# Patient Record
Sex: Male | Born: 1985 | Race: White | Hispanic: No | Marital: Married | Smoking: Never smoker
Health system: Southern US, Community
[De-identification: ages and names within clinical notes are randomized; demographics above are authoritative.]

## PROBLEM LIST (undated history)

## (undated) DIAGNOSIS — T7840XA Allergy, unspecified, initial encounter: Secondary | ICD-10-CM

## (undated) DIAGNOSIS — J45909 Unspecified asthma, uncomplicated: Secondary | ICD-10-CM

## (undated) DIAGNOSIS — I10 Essential (primary) hypertension: Secondary | ICD-10-CM

## (undated) DIAGNOSIS — I82409 Acute embolism and thrombosis of unspecified deep veins of unspecified lower extremity: Secondary | ICD-10-CM

## (undated) HISTORY — DX: Acute embolism and thrombosis of unspecified deep veins of unspecified lower extremity: I82.409

## (undated) HISTORY — DX: Unspecified asthma, uncomplicated: J45.909

## (undated) HISTORY — DX: Allergy, unspecified, initial encounter: T78.40XA

## (undated) HISTORY — DX: Essential (primary) hypertension: I10

## (undated) HISTORY — PX: APPENDECTOMY: SHX54

---

## 2008-10-13 ENCOUNTER — Encounter (INDEPENDENT_AMBULATORY_CARE_PROVIDER_SITE_OTHER): Payer: Self-pay | Admitting: *Deleted

## 2008-10-14 ENCOUNTER — Inpatient Hospital Stay (HOSPITAL_COMMUNITY): Admission: EM | Admit: 2008-10-14 | Discharge: 2008-10-15 | Payer: Self-pay | Admitting: Emergency Medicine

## 2011-05-02 NOTE — Op Note (Signed)
NAMEMITCHEL, DELDUCA               ACCOUNT NO.:  [id number]   MEDICAL RECORD NO.:  [id number]          PATIENT TYPE:  OBV   LOCATION:  1610                         FACILITY:  [facility]   PHYSICIAN:  Alfonse Ras, MD   DATE OF BIRTH:  [birth year]   DATE OF PROCEDURE:  DATE OF DISCHARGE:                               OPERATIVE REPORT   PREOPERATIVE DIAGNOSIS:  Acute appendicitis.   POSTOPERATIVE DIAGNOSIS:  Gangrenous appendicitis.   PROCEDURE:  Laparoscopic appendectomy.   SURGEON:  Alfonse Ras, M.D.   ANESTHESIA:  General.   DESCRIPTION:  The patient was taken to the operating room and placed in  supine position.  After adequate general anesthesia was induced, the  Foley catheter was placed and the abdomen was prepped and draped in  normal sterile fashion.  After the appropriate time out with the OR  staff, using a left subcostal incision, I utilized a 12-mm OptiVu and  entered the peritoneum with direct visualization.  Additional 12-mm port  was placed in the left lower quadrant and 5-mm port was placed in the  left mid-abdomen, both visioned.  The appendix was mobilized and was  quite discolored.  The mesentery, however, was difficult.  Using a  harmonic scalpel, some fatty mesentery was taken down and the base of  the appendix was easily visualized and was transected using a white load  GIA 45-mm stapling device.  The staple line appeared intact.  The right  lower quadrant and right pericolic gutter was copiously irrigated with  saline.  The appendix which had been placed in EndoCatch bag was removed  from the left lower quadrant port.  Adequate hemostasis was ensured.  Pneumoperitoneum was released.  Trocars were removed and skin incisions  were October 27, 2009closed with subcuticular 4-0 Vicryl sutures.  Steri-  Strips and dressings were applied.  The patient tolerated the procedure  well and went to PACU in good condition.      Alfonse Ras, MD  Electronically Signed     KRE/MEDQ  D:  10/13/2008  T:  10/14/2008  Job:  [id number]

## 2011-05-02 NOTE — H&P (Signed)
NAME:  Jamie Newton, Jamie Newton NO.:  [id number]   MEDICAL RECORD NO.:  [id number]          PATIENT TYPE:  EMS   LOCATION:  ED                           FACILITY:  [facility]   PHYSICIAN:  Alfonse Ras, MD   DATE OF BIRTH:  [birth year]   DATE OF ADMISSION:  10/12/2008  DATE OF DISCHARGE:                              HISTORY & PHYSICAL   ADMISSION DIAGNOSIS:  Acute appendicitis.   HISTORY OF PRESENT ILLNESS:  The patient is a very pleasant 25 year old  white male with a chronic history over the last 10 months of  intermittent right-sided abdominal pain, and nausea.  The patient  presented to the emergency room with complaints of this again last  night.  CT scan of the abdomen showed inflammation near and around the  appendix with an enlarged appendix.  No evidence of free fluid or  perforation.  His white count was elevated 15.6000.   PAST MEDICAL HISTORY:  Significant for asthma.   MEDICATIONS:  Include albuterol only.   SOCIAL HISTORY:  Negative for tobacco use.  He works at a [employer].   PHYSICAL EXAMINATION:  GENERAL: On physical exam, he is an age-  appropriate white male in no distress.  VITAL SIGNS:  His temperature is 101.2, blood pressure was 147/108,  heart rate is 120, and respiratory rate is 16.  HEENT:  Exam is benign.  Normocephalic, atraumatic.  Pupils are equal,  round, and reactive.  LUNGS:  Clear to auscultation and percussion x2.  Heart:  Regular rhythm.  Mildly tachycardiac.  ABDOMEN:  Soft, minimally tender in the right lower quadrant even to  fairly deep palpation.  EXTREMITY:  Exam shows no clubbing, cyanosis or edema and significant  amount of tattooing.   IMPRESSION:  Acute appendicitis.   PLAN:  Admission, IV antibiotics and laparoscopic appendectomy.  I  discussed the risks, benefits, and options with the patient.  He  understands the risks of bleeding, infection, perforation, conversion to  open procedure and he wishes to  proceed.      Alfonse Ras, MD  Electronically Signed     KRE/MEDQ  D:  10/13/2008  T:  10/13/2008  Job:  [id number]

## 2011-05-02 NOTE — Discharge Summary (Signed)
NAMETADD, HOLTMEYER               ACCOUNT NO.:  [id number]   MEDICAL RECORD NO.:  [id number]          PATIENT TYPE:  INP   LOCATION:  1618                         FACILITY:  [facility]   PHYSICIAN:  Alfonse Ras, MD   DATE OF BIRTH:  1986/04/30   DATE OF ADMISSION:  10/12/2008  DATE OF DISCHARGE:  10/15/2008                               DISCHARGE SUMMARY   ADMISSION DIAGNOSIS:  Acute appendicitis.   DISCHARGE DIAGNOSIS:  Gangrenous appendicitis.   MEDICATIONS:  At discharge include ciprofloxacin 5 mg 1 orally, twice a  day., Vicodin p.r.n., and albuterol nebulizers p.r.n.   HOSPITAL COURSE:  The patient was admitted with acute appendicitis went  to the operating room, had a laparoscopic appendectomy for gangrenous  appendicitis.  He was maintained on IV antibiotics.  He spiked a  temperature to 103 at night of surgery.  However, over the next 24 hours  defervesced.  His white count also decreased to 12,000.  By  postoperative day #2, he was feeling well, taking p.o., and was asking  to go home.  He was discharged to home and will follow up with me in 3  weeks.      Alfonse Ras, MD  Electronically Signed     KRE/MEDQ  D:  10/15/2008  T:  10/15/2008  Job:  [id number]

## 2011-09-19 LAB — CBC
HCT: 39.5
HCT: 43
Hemoglobin: 16.8
MCHC: 35.4
MCV: 91
MCV: 91.5
Platelets: 149 — ABNORMAL LOW
RBC: 4.33
RBC: 4.7
RBC: 5.38
RDW: 12.8
RDW: 13
WBC: 12.5 — ABNORMAL HIGH
WBC: 13.1 — ABNORMAL HIGH

## 2011-09-19 LAB — HEMOGLOBIN AND HEMATOCRIT, BLOOD
HCT: 45
Hemoglobin: 15.5

## 2011-09-19 LAB — COMPREHENSIVE METABOLIC PANEL
AST: 28
Albumin: 4.2
Alkaline Phosphatase: 85
BUN: 9
Calcium: 9.4
Chloride: 100
Creatinine, Ser: 0.97
GFR calc Af Amer: >60
GFR calc non Af Amer: >60
Glucose, Bld: 92
Potassium: 3.2 — ABNORMAL LOW
Total Bilirubin: 1.4 — ABNORMAL HIGH
Total Protein: 7.8

## 2011-09-19 LAB — DIFFERENTIAL
Basophils Relative: 0
Eosinophils Absolute: 0
Lymphocytes Relative: 7 — ABNORMAL LOW
Monocytes Absolute: 0.7
Neutro Abs: 13.8 — ABNORMAL HIGH
Neutrophils Relative %: 89 — ABNORMAL HIGH

## 2011-09-19 LAB — URINALYSIS, ROUTINE W REFLEX MICROSCOPIC
Glucose, UA: NEGATIVE
Specific Gravity, Urine: 1.007
Urobilinogen, UA: 1

## 2011-09-19 LAB — URINE CULTURE
Colony Count: NO GROWTH
Culture: NO GROWTH

## 2011-09-19 LAB — URINE MICROSCOPIC-ADD ON

## 2012-07-24 ENCOUNTER — Ambulatory Visit: Payer: Self-pay | Admitting: Family Medicine

## 2012-07-24 VITALS — BP 143/98 | HR 106 | Temp 97.9°F | Resp 16 | Ht 73.0 in | Wt 342.0 lb

## 2012-07-24 DIAGNOSIS — B356 Tinea cruris: Secondary | ICD-10-CM

## 2012-07-24 MED ORDER — LOSARTAN POTASSIUM 100 MG PO TABS: 100.0000 mg | ORAL_TABLET | Freq: Every day | ORAL | Status: DC

## 2012-07-24 MED ORDER — KETOCONAZOLE 200 MG PO TABS: 200.0000 mg | ORAL_TABLET | Freq: Every day | ORAL | Status: AC

## 2012-07-24 NOTE — Progress Notes (Signed)
Three days of scrotal rash.  Unresponsive to lotrimin.  O:  Obvious scrotal erythema and swelling with scaling on shaft of pnis as well  1. Tinea cruris  ketoconazole (NIZORAL) 200 MG tablet

## 2014-03-19 ENCOUNTER — Other Ambulatory Visit: Payer: Self-pay | Admitting: Orthopaedic Surgery

## 2014-03-19 DIAGNOSIS — M25571 Pain in right ankle and joints of right foot: Secondary | ICD-10-CM

## 2014-05-14 ENCOUNTER — Telehealth: Payer: Self-pay

## 2014-05-14 DIAGNOSIS — K625 Hemorrhage of anus and rectum: Secondary | ICD-10-CM

## 2014-05-14 MED ORDER — MOVIPREP 100 G PO SOLR: 1.0000 | Freq: Once | ORAL | Status: DC

## 2014-05-14 NOTE — Telephone Encounter (Signed)
Left message on machine to call back  

## 2014-05-14 NOTE — Telephone Encounter (Signed)
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 He is 27, has BMI of 45, has intermittent rectal bleeding for about 3 years. I'd like to have him scheduled for colonoscopy at [facility]. No rush. His contact number is [phone]

## 2014-05-14 NOTE — Telephone Encounter (Signed)
Pt has been scheduled he will come by tomorrow morning around 8 to get instructions

## 2014-05-18 ENCOUNTER — Encounter: Payer: Self-pay | Admitting: Gastroenterology

## 2014-05-18 ENCOUNTER — Ambulatory Visit (AMBULATORY_SURGERY_CENTER): Payer: BC Managed Care – PPO | Admitting: Gastroenterology

## 2014-05-18 VITALS — BP 105/45 | HR 72 | Temp 96.8°F | Resp 20 | Ht 73.0 in | Wt 342.0 lb

## 2014-05-18 DIAGNOSIS — K625 Hemorrhage of anus and rectum: Secondary | ICD-10-CM

## 2014-05-18 MED ORDER — SODIUM CHLORIDE 0.9 % IV SOLN: 500.0000 mL | INTRAVENOUS | Status: DC

## 2014-05-18 NOTE — Progress Notes (Signed)
Tatoo's bilateral arms and hands, stated every things out when questioned about piecing.

## 2014-05-18 NOTE — Patient Instructions (Signed)
Normal colonoscopy today. Resume current medications. Call us with any questions or concerns. Thank you!!  YOU HAD AN ENDOSCOPIC PROCEDURE TODAY AT THE [facility]: Refer to the procedure report that was given to you for any specific questions about what was found during the examination.  If the procedure report does not answer your questions, please call your gastroenterologist to clarify.  If you requested that your care partner not be given the details of your procedure findings, then the procedure report has been included in a sealed envelope for you to review at your convenience later.  YOU SHOULD EXPECT: Some feelings of bloating in the abdomen. Passage of more gas than usual.  Walking can help get rid of the air that was put into your GI tract during the procedure and reduce the bloating. If you had a lower endoscopy (such as a colonoscopy or flexible sigmoidoscopy) you may notice spotting of blood in your stool or on the toilet paper. If you underwent a bowel prep for your procedure, then you may not have a normal bowel movement for a few days.  DIET: Your first meal following the procedure should be a light meal and then it is ok to progress to your normal diet.  A half-sandwich or bowl of soup is an example of a good first meal.  Heavy or fried foods are harder to digest and may make you feel nauseous or bloated.  Likewise meals heavy in dairy and vegetables can cause extra gas to form and this can also increase the bloating.  Drink plenty of fluids but you should avoid alcoholic beverages for 24 hours.  ACTIVITY: Your care partner should take you home directly after the procedure.  You should plan to take it easy, moving slowly for the rest of the day.  You can resume normal activity the day after the procedure however you should NOT DRIVE or use heavy machinery for 24 hours (because of the sedation medicines used during the test).    SYMPTOMS TO REPORT IMMEDIATELY: A  gastroenterologist can be reached at any hour.  During normal business hours, 8:30 AM to 5:00 PM Monday through Friday, call [phone].  After hours and on weekends, please call the GI answering service at [phone] who will take a message and have the physician on call contact you.   Following lower endoscopy (colonoscopy or flexible sigmoidoscopy):  Excessive amounts of blood in the stool  Significant tenderness or worsening of abdominal pains  Swelling of the abdomen that is new, acute  Fever of 100F or higher  Following upper endoscopy (EGD)  Vomiting of blood or coffee ground material  New chest pain or pain under the shoulder blades  Painful or persistently difficult swallowing  New shortness of breath  Fever of 100F or higher  Black, tarry-looking stools  FOLLOW UP: If any biopsies were taken you will be contacted by phone or by letter within the next 1-3 weeks.  Call your gastroenterologist if you have not heard about the biopsies in 3 weeks.  Our staff will call the home number listed on your records the next business day following your procedure to check on you and address any questions or concerns that you may have at that time regarding the information given to you following your procedure. This is a courtesy call and so if there is no answer at the home number and we have not heard from you through the emergency physician on call, we will assume that  you have returned to your regular daily activities without incident.  SIGNATURES/CONFIDENTIALITY: You and/or your care partner have signed paperwork which will be entered into your electronic medical record.  These signatures attest to the fact that that the information above on your After Visit Summary has been reviewed and is understood.  Full responsibility of the confidentiality of this discharge information lies with you and/or your care-partner.

## 2014-05-18 NOTE — Progress Notes (Signed)
Procedure ends, to recovery, report given and VSS. 

## 2014-05-18 NOTE — Op Note (Signed)
[facility] [address] [place], [address]   COLONOSCOPY PROCEDURE REPORT  PATIENT: Jamie Newton, Jamie Newton  MR#: [id number] BIRTHDATE: 10/14/1986 , 27  yrs. old GENDER: Male ENDOSCOPIST: Rachael Fee, MD PROCEDURE DATE:  05/18/2014 PROCEDURE:   Colonoscopy, diagnostic First Screening Colonoscopy - Avg.  risk and is 50 yrs.  old or older - No.  Prior Negative Screening - Now for repeat screening. N/A  History of Adenoma - Now for follow-up colonoscopy & has been > or = to 3 yrs.  N/A  Polyps Removed Today? No.  Recommend repeat exam, <10 yrs? No. ASA CLASS:   Class II INDICATIONS:Rectal Bleeding. MEDICATIONS: MAC sedation, administered by CRNA and propofol (Diprivan) 350mg  IV  DESCRIPTION OF PROCEDURE:   After the risks benefits and alternatives of the procedure were thoroughly explained, informed consent was obtained.  A digital rectal exam revealed no abnormalities of the rectum.   The LB [id number]  endoscope was introduced through the anus and advanced to the cecum, which was identified by both the appendix and ileocecal valve. No adverse events experienced.   The quality of the prep was good.  The instrument was then slowly withdrawn as the colon was fully examined.   COLON FINDINGS: A normal appearing cecum, ileocecal valve, and appendiceal orifice were identified.  The ascending, hepatic flexure, transverse, splenic flexure, descending, sigmoid colon and rectum appeared unremarkable.  No polyps or cancers were seen. Retroflexed views revealed no abnormalities. The time to cecum=3 minutes 47 seconds.  Withdrawal time=7 minutes 21 seconds.  The scope was withdrawn and the procedure completed. COMPLICATIONS: There were no complications.  ENDOSCOPIC IMPRESSION: Normal colon The mild intermittent rectal bleeding that you see if almost certainly from small hemorrhoids that can come and go.  RECOMMENDATIONS: Follow clinically.  You should start  colon cancer screening at age 32.   eSigned:  Rachael Fee, MD 05/18/2014 11:01 AM

## 2014-05-19 ENCOUNTER — Telehealth: Payer: Self-pay | Admitting: *Deleted

## 2014-05-19 NOTE — Telephone Encounter (Signed)
Message left

## 2016-02-03 ENCOUNTER — Other Ambulatory Visit: Payer: Self-pay | Admitting: Sports Medicine

## 2016-02-03 DIAGNOSIS — M25521 Pain in right elbow: Secondary | ICD-10-CM

## 2016-02-08 ENCOUNTER — Other Ambulatory Visit: Payer: Self-pay | Admitting: Sports Medicine

## 2016-02-08 ENCOUNTER — Ambulatory Visit
Admission: RE | Admit: 2016-02-08 | Discharge: 2016-02-08 | Disposition: A | Discharge status: Home / Self Care | Payer: BLUE CROSS/BLUE SHIELD | Source: Ambulatory Visit | Attending: Sports Medicine | Admitting: Sports Medicine

## 2016-02-08 DIAGNOSIS — Z77018 Contact with and (suspected) exposure to other hazardous metals: Secondary | ICD-10-CM

## 2016-02-08 DIAGNOSIS — M25521 Pain in right elbow: Secondary | ICD-10-CM

## 2016-10-03 DIAGNOSIS — I10 Essential (primary) hypertension: Secondary | ICD-10-CM | POA: Insufficient documentation

## 2016-10-04 ENCOUNTER — Encounter: Payer: Self-pay | Admitting: Family Medicine

## 2016-10-04 ENCOUNTER — Ambulatory Visit (INDEPENDENT_AMBULATORY_CARE_PROVIDER_SITE_OTHER): Payer: BLUE CROSS/BLUE SHIELD | Admitting: Family Medicine

## 2016-10-04 VITALS — BP 140/97 | HR 87 | Ht 73.25 in | Wt 359.5 lb

## 2016-10-04 DIAGNOSIS — I10 Essential (primary) hypertension: Secondary | ICD-10-CM | POA: Diagnosis not present

## 2016-10-04 DIAGNOSIS — R5382 Chronic fatigue, unspecified: Secondary | ICD-10-CM | POA: Diagnosis not present

## 2016-10-04 DIAGNOSIS — J3089 Other allergic rhinitis: Secondary | ICD-10-CM

## 2016-10-04 DIAGNOSIS — J4520 Mild intermittent asthma, uncomplicated: Secondary | ICD-10-CM

## 2016-10-04 NOTE — Patient Instructions (Addendum)
Over the next couple weeks until I see you in follow-up please check her blood pressure at home and write it down and keep a log and bring in next office visit  Also please download my fitness pallor or lose it app and please track all foods and drink that you put in your body until follow-up. This will give [place] a good idea of exactly what right and what's wrong with your diet.    Exercising to Lose Weight Exercising can help you to lose weight. In order to lose weight through exercise, you need to do vigorous-intensity exercise. You can tell that you are exercising with vigorous intensity if you are breathing very hard and fast and cannot hold a conversation while exercising. Moderate-intensity exercise helps to maintain your current weight. You can tell that you are exercising at a moderate level if you have a higher heart rate and faster breathing, but you are still able to hold a conversation. HOW OFTEN SHOULD I EXERCISE? Choose an activity that you enjoy and set realistic goals. Your health care provider can help you to make an activity plan that works for you. Exercise regularly as directed by your health care provider. This may include:  Doing resistance training twice each week, such as:  Push-ups.  Sit-ups.  Lifting weights.  Using resistance bands.  Doing a given intensity of exercise for a given amount of time. Choose from these options:  150 minutes of moderate-intensity exercise every week.  75 minutes of vigorous-intensity exercise every week.  A mix of moderate-intensity and vigorous-intensity exercise every week. Children, pregnant women, people who are out of shape, people who are overweight, and older adults may need to consult a health care provider for individual recommendations. If you have any sort of medical condition, be sure to consult your health care provider before starting a new exercise program. WHAT ARE SOME ACTIVITIES THAT CAN HELP ME TO LOSE WEIGHT?    Walking at a rate of at least 4.5 miles an hour.  Jogging or running at a rate of 5 miles per hour.  Biking at a rate of at least 10 miles per hour.  Lap swimming.  Roller-skating or in-line skating.  Cross-country skiing.  Vigorous competitive sports, such as football, basketball, and soccer.  Jumping rope.  Aerobic dancing. HOW CAN I BE MORE ACTIVE IN MY DAY-TO-DAY ACTIVITIES?  Use the stairs instead of the elevator.  Take a walk during your lunch break.  If you drive, park your car farther away from work or school.  If you take public transportation, get off one stop early and walk the rest of the way.  Make all of your phone calls while standing up and walking around.  Get up, stretch, and walk around every 30 minutes throughout the day. WHAT GUIDELINES SHOULD I FOLLOW WHILE EXERCISING?  Do not exercise so much that you hurt yourself, feel dizzy, or get very short of breath.  Consult your health care provider prior to starting a new exercise program.  Wear comfortable clothes and shoes with good support.  Drink plenty of water while you exercise to prevent dehydration or heat stroke. Body water is lost during exercise and must be replaced.  Work out until you breathe faster and your heart beats faster.   This information is not intended to replace advice given to you by your health care provider. Make sure you discuss any questions you have with your health care provider.   Document Released: 01/06/2011 Document Revised:  12/25/2014 Document Reviewed: 05/07/2014 Elsevier Interactive Patient Education 2016 [employer]    Calorie Counting for [employer] Loss Calories are energy you get from the things you eat and drink. Your body uses this energy to keep you going throughout the day. The number of calories you eat affects your weight. When you eat more calories than your body needs, your body stores the extra calories as fat. When you eat fewer calories than your  body needs, your body burns fat to get the energy it needs. Calorie counting means keeping track of how many calories you eat and drink each day. If you make sure to eat fewer calories than your body needs, you should lose weight. In order for calorie counting to work, you will need to eat the number of calories that are right for you in a day to lose a healthy amount of weight per week. A healthy amount of weight to lose per week is usually 1-2 lb (0.5-0.9 kg). A dietitian can determine how many calories you need in a day and give you suggestions on how to reach your calorie goal.  WHAT IS MY MY PLAN? My goal is to have __________ calories per day.  If I have this many calories per day, I should lose around __________ pounds per week. WHAT DO I NEED TO KNOW ABOUT CALORIE COUNTING? In order to meet your daily calorie goal, you will need to:  Find out how many calories are in each food you would like to eat. Try to do this before you eat.  Decide how much of the food you can eat.  Write down what you ate and how many calories it had. Doing this is called keeping a food log. WHERE DO I FIND CALORIE INFORMATION? The number of calories in a food can be found on a Nutrition Facts label. Note that all the information on a label is based on a specific serving of the food. If a food does not have a Nutrition Facts label, try to look up the calories online or ask your dietitian for help. HOW DO I DECIDE HOW MUCH TO EAT? To decide how much of the food you can eat, you will need to consider both the number of calories in one serving and the size of one serving. This information can be found on the Nutrition Facts label. If a food does not have a Nutrition Facts label, look up the information online or ask your dietitian for help. Remember that calories are listed per serving. If you choose to have more than one serving of a food, you will have to multiply the calories per serving by the amount of servings you  plan to eat. For example, the label on a package of bread might say that a serving size is 1 slice and that there are 90 calories in a serving. If you eat 1 slice, you will have eaten 90 calories. If you eat 2 slices, you will have eaten 180 calories. HOW DO I KEEP A FOOD LOG? After each meal, record the following information in your food log:  What you ate.  How much of it you ate.  How many calories it had.  Then, add up your calories. Keep your food log near you, such as in a small notebook in your pocket. Another option is to use a mobile app or website. Some programs will calculate calories for you and show you how many calories you have left each time you add an item  to the log. WHAT ARE SOME CALORIE COUNTING TIPS?  Use your calories on foods and drinks that will fill you up and not leave you hungry. Some examples of this include foods like nuts and nut butters, vegetables, lean proteins, and high-fiber foods (more than 5 g fiber per serving).  Eat nutritious foods and avoid empty calories. Empty calories are calories you get from foods or beverages that do not have many nutrients, such as candy and soda. It is better to have a nutritious high-calorie food (such as an avocado) than a food with few nutrients (such as a bag of chips).  Know how many calories are in the foods you eat most often. This way, you do not have to look up how many calories they have each time you eat them.  Look out for foods that may seem like low-calorie foods but are really high-calorie foods, such as baked goods, soda, and fat-free candy.  Pay attention to calories in drinks. Drinks such as sodas, specialty coffee drinks, alcohol, and juices have a lot of calories yet do not fill you up. Choose low-calorie drinks like water and diet drinks.  Focus your calorie counting efforts on higher calorie items. Logging the calories in a garden salad that contains only vegetables is less important than calculating the  calories in a milk shake.  Find a way of tracking calories that works for you. Get creative. Most people who are successful find ways to keep track of how much they eat in a day, even if they do not count every calorie. WHAT ARE SOME PORTION CONTROL TIPS?  Know how many calories are in a serving. This will help you know how many servings of a certain food you can have.  Use a measuring cup to measure serving sizes. This is helpful when you start out. With time, you will be able to estimate serving sizes for some foods.  Take some time to put servings of different foods on your favorite plates, bowls, and cups so you know what a serving looks like.  Try not to eat straight from a bag or box. Doing this can lead to overeating. Put the amount you would like to eat in a cup or on a plate to make sure you are eating the right portion.  Use smaller plates, glasses, and bowls to prevent overeating. This is a quick and easy way to practice portion control. If your plate is smaller, less food can fit on it.  Try not to multitask while eating, such as watching TV or using your computer. If it is time to eat, sit down at a table and enjoy your food. Doing this will help you to start recognizing when you are full. It will also make you more aware of what and how much you are eating. HOW CAN I CALORIE COUNT WHEN EATING OUT?  Ask for smaller portion sizes or child-sized portions.  Consider sharing an entree and sides instead of getting your own entree.  If you get your own entree, eat only half. Ask for a box at the beginning of your meal and put the rest of your entree in it so you are not tempted to eat it.  Look for the calories on the menu. If calories are listed, choose the lower calorie options.  Choose dishes that include vegetables, fruits, whole grains, low-fat dairy products, and lean protein. Focusing on smart food choices from each of the 5 food groups can help you stay on track  at  restaurants.  Choose items that are boiled, broiled, grilled, or steamed.  Choose water, milk, unsweetened iced tea, or other drinks without added sugars. If you want an alcoholic beverage, choose a lower calorie option. For example, a regular margarita can have up to 700 calories and a glass of wine has around 150.  Stay away from items that are buttered, battered, fried, or served with cream sauce. Items labeled "crispy" are usually fried, unless stated otherwise.  Ask for dressings, sauces, and syrups on the side. These are usually very high in calories, so do not eat much of them.  Watch out for salads. Many people think salads are a healthy option, but this is often not the case. Many salads come with bacon, fried chicken, lots of cheese, fried chips, and dressing. All of these items have a lot of calories. If you want a salad, choose a garden salad and ask for grilled meats or steak. Ask for the dressing on the side, or ask for olive oil and vinegar or lemon to use as dressing.  Estimate how many servings of a food you are given. For example, a serving of cooked rice is  cup or about the size of half a tennis ball or one cupcake wrapper. Knowing serving sizes will help you be aware of how much food you are eating at restaurants. The list below tells you how big or small some common portion sizes are based on everyday objects.  1 oz--4 stacked dice.  3 oz--1 deck of cards.  1 tsp--1 dice.  1 Tbsp-- a Ping-Pong ball.  2 Tbsp--1 Ping-Pong ball.   cup--1 tennis ball or 1 cupcake wrapper.  1 cup--1 baseball.   This information is not intended to replace advice given to you by your health care provider. Make sure you discuss any questions you have with your health care provider.   Document Released: 12/04/2005 Document Revised: 12/25/2014 Document Reviewed: 10/09/2013 Elsevier Interactive Patient Education [employer]

## 2016-10-04 NOTE — Progress Notes (Signed)
New patient office visit note:  Impression and Recommendations:    1. HTN (hypertension), benign   2. Severe obesity (BMI >= 40) (HCC)   3. Environmental and seasonal allergies   4. Chronic fatigue   5. Mild intermittent asthma, uncomplicated     Over the next couple weeks until I see you in follow-up please check Your blood pressure at home and write it down and keep a log and bring in next office visit  Also please download my fitness pal or lose it app and please track all foods and drink that you put in your body until follow-up. This will give [place] a good idea of exactly what is right and what's wrong with your diet.   HTN (hypertension), benign Above goal. Today's blood pressure 140/97. Counseling done. Dietary and lifestyle modifications discussed. Monitor at home and follow-up blood pressure and fasting labs near future.  Severe obesity (BMI >= 40) (HCC) Explained to patient what BMI refers to, and what it means medically.    Told patient to think about it as a "medical risk stratification measurement" and how increasing BMI is associated with increasing risk/ or worsening state of various diseases such as hypertension, hyperlipidemia, diabetes, premature OA, depression etc.  American Heart Association guidelines for healthy diet, basically Mediterranean diet, and exercise guidelines of 30 minutes 5 days per week or more discussed in detail.  Health counseling performed.  All questions answered.  Mild intermittent asthma, uncomplicated Stable, a symptomatic currently. Not using albuterol in many many months.  Environmental and seasonal allergies Asymptomatic currently. Discussed fall allergies the patient. Over the counter medications discussed.   Follow-up as needed when necessary  Chronic fatigue We'll obtain fasting labs in the near future including TSH, vitamin D, but and B12, A1c.   Did discuss physical deconditioning with patient which is likely cause of his  fatigue. Dietary and lifestyle modifications discussed.    Orders Placed This Encounter  Procedures  . CBC with Differential/Platelet  . Hemoglobin A1c  . Lipid Panel w/reflex Direct LDL  . T4, free  . TSH  . VITAMIN D 25 Hydroxy (Vit-D Deficiency, Fractures)  . Vitamin B12    Pt was in the office today for 40+ minutes, with over 50% time spent in face to face counseling of various medical concerns and in coordination of care New Prescriptions   No medications on file    Modified Medications   No medications on file    Discontinued Medications   ALBUTEROL (PROVENTIL HFA;VENTOLIN HFA) 108 (90 BASE) MCG/ACT INHALER    Inhale 2 puffs into the lungs every 6 (six) hours as needed.    Return for F BW near future and then follow-up with me 3 weeks later - review labs, BP , diett.  The patient was counseled, risk factors were discussed, anticipatory guidance given.  Gross side effects, risk and benefits, and alternatives of medications discussed with patient.  Patient is aware that all medications have potential side effects and we are unable to predict every side effect or drug-drug interaction that may occur.  Expresses verbal understanding and consents to current therapy plan and treatment regimen.  Please see AVS handed out to patient at the end of our visit for further patient instructions/ counseling done pertaining to today's office visit.    Note: This document was prepared using Dragon voice recognition software and may include unintentional dictation errors.  ----------------------------------------------------------------------------------------------------------------------    Subjective:    Chief  Complaint  Patient presents with  . Establish Care    HPI: Jamie Newton is a pleasant 30 y.o. male who presents to [facility] today to review their medical history with me and establish care.   I asked the patient to review their  chronic problem list with me to ensure everything was updated and accurate.     Patient is married  to [employer]OPE with 2 children at home 64-year-old and one-month-old.  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 Works 12-14hrs days.  Hasn't seen PCP doc in 10-15 yrs now.  [facility] [place] [facility] [place]. --> only been 3-5 times total to  See doc in that time   280-300 lbs-- is his usual wt.  Over past 2 yrs--> has had fatigue, laziness.   Walks a lot at work still, gained all the extra wt over 2 yrs, hasn't changed diet or anything.    Was put on BP meds about 5 yrs ago--> was on it months--> but said he didn't need it anymore.  2 mo ago--> went to [facility]--> Told BP was essentially N.     Patient Care Team    Relationship Specialty Notifications Start End  Thomasene Lot, DO Referring Physician Family Medicine  10/11/16      Wt Readings from Last 3 Encounters:  10/04/16 (!) 359 lb 8 oz (163.1 kg)  02/08/16 (!) 340 lb (154.2 kg)  05/18/14 (!) 342 lb (155.1 kg)   BP Readings from Last 3 Encounters:  10/04/16 (!) 140/97  05/18/14 (!) 105/45  07/24/12 (!) 143/98   Pulse Readings from Last 3 Encounters:  10/04/16 87  05/18/14 72  07/24/12 (!) 106   BMI Readings from Last 3 Encounters:  10/04/16 47.11 kg/m  02/08/16 44.86 kg/m  05/18/14 45.12 kg/m     Patient Active Problem List   Diagnosis Date Noted  . Severe obesity (BMI >= 40) (HCC) 10/15/2016    Priority: High  . HTN (hypertension), benign 10/03/2016    Priority: High  . Mild intermittent asthma, uncomplicated 10/15/2016    Priority: Medium  . Environmental and seasonal allergies 10/15/2016    Priority: Low  . Chronic fatigue 10/15/2016    Priority: Low     Past Medical History:  Diagnosis Date  . Allergy    SEASONAL  . Asthma   . Hypertension      Past Surgical History:  Procedure Laterality Date  . APPENDECTOMY       Family History  Problem Relation Age of Onset  .  Healthy Brother   . Asthma Daughter   . Arthritis Maternal Grandmother   . Healthy Maternal Grandfather   . Early death Paternal Grandfather     unknown  . Healthy Brother   . Healthy Daughter      History  Drug Use No    History  Alcohol Use  . 1.2 oz/week  . 2 Cans of beer per week    History  Smoking Status  . Never Smoker  Smokeless Tobacco  . Never Used    Patient's Medications  New Prescriptions   No medications on file  Previous Medications   LOSARTAN (COZAAR) 100 MG TABLET    Take 1 tablet (100 mg total) by mouth daily.  Modified Medications   No medications on file  Discontinued Medications   ALBUTEROL (PROVENTIL HFA;VENTOLIN HFA) 108 (90 BASE) MCG/ACT INHALER    Inhale 2 puffs into the lungs  every 6 (six) hours as needed.    Allergies: Amoxicillin and Penicillins  Review of Systems  Constitutional: Negative.  Negative for chills, diaphoresis, fever, malaise/fatigue and weight loss.  HENT: Negative.  Negative for congestion, sore throat and tinnitus.   Eyes: Negative.  Negative for blurred vision, double vision and photophobia.  Respiratory: Negative.  Negative for cough and wheezing.   Cardiovascular: Negative.  Negative for chest pain and palpitations.  Gastrointestinal: Negative.  Negative for blood in stool, diarrhea, nausea and vomiting.  Genitourinary: Negative.  Negative for dysuria, frequency and urgency.  Musculoskeletal: Negative.  Negative for joint pain and myalgias.  Skin: Negative.  Negative for itching and rash.  Neurological: Negative.  Negative for dizziness, focal weakness, weakness and headaches.  Endo/Heme/Allergies: Negative.  Negative for environmental allergies and polydipsia. Does not bruise/bleed easily.  Psychiatric/Behavioral: Negative.  Negative for depression and memory loss. The patient is not nervous/anxious and does not have insomnia.      Objective:   Blood pressure (!) 140/97, pulse 87, height 6' 1.25" (1.861 m),  weight (!) 359 lb 8 oz (163.1 kg). Body mass index is 47.11 kg/m. General: Well Developed, well nourished, and in no acute distress.  Neuro: Alert and oriented x3, extra-ocular muscles intact, sensation grossly intact.  HEENT: Normocephalic, atraumatic, pupils equal round reactive to light, neck supple Skin: no gross suspicious lesions or rashes  Cardiac: Regular rate and rhythm, no murmurs rubs or gallops.  Respiratory: Essentially clear to auscultation bilaterally. Not using accessory muscles, speaking in full sentences.  Abdominal: Soft, not grossly distended Musculoskeletal: Ambulates w/o diff, FROM * 4 ext.  Vasc: less 2 sec cap RF, warm and pink  Psych:  No HI/SI, judgement and insight good, Euthymic mood. Full Affect.

## 2016-10-11 ENCOUNTER — Other Ambulatory Visit: Payer: Self-pay

## 2016-10-11 ENCOUNTER — Other Ambulatory Visit (INDEPENDENT_AMBULATORY_CARE_PROVIDER_SITE_OTHER): Payer: BLUE CROSS/BLUE SHIELD

## 2016-10-11 DIAGNOSIS — R5383 Other fatigue: Secondary | ICD-10-CM

## 2016-10-11 DIAGNOSIS — Z13220 Encounter for screening for lipoid disorders: Secondary | ICD-10-CM

## 2016-10-11 DIAGNOSIS — Z130 Encounter for screening for diseases of the blood and blood-forming organs and certain disorders involving the immune mechanism: Secondary | ICD-10-CM

## 2016-10-11 DIAGNOSIS — Z1321 Encounter for screening for nutritional disorder: Secondary | ICD-10-CM

## 2016-10-11 DIAGNOSIS — Z131 Encounter for screening for diabetes mellitus: Secondary | ICD-10-CM

## 2016-10-11 DIAGNOSIS — I10 Essential (primary) hypertension: Secondary | ICD-10-CM

## 2016-10-11 DIAGNOSIS — Z1329 Encounter for screening for other suspected endocrine disorder: Secondary | ICD-10-CM

## 2016-10-12 LAB — CBC WITH DIFFERENTIAL/PLATELET
BASOS PCT: 0 %
Basophils Absolute: 0 cells/uL (ref 0–200)
EOS ABS: 92 {cells}/uL (ref 15–500)
Eosinophils Relative: 1 %
HEMATOCRIT: 42.3 % (ref 38.5–50.0)
HEMOGLOBIN: 14.5 g/dL (ref 13.2–17.1)
LYMPHS ABS: 2944 {cells}/uL (ref 850–3900)
LYMPHS PCT: 32 %
MCH: 29.6 pg (ref 27.0–33.0)
MCHC: 34.3 g/dL (ref 32.0–36.0)
MCV: 86.3 fL (ref 80.0–100.0)
MONO ABS: 920 {cells}/uL (ref 200–950)
MPV: 10.1 fL (ref 7.5–12.5)
Monocytes Relative: 10 %
NEUTROS PCT: 57 %
Neutro Abs: 5244 cells/uL (ref 1500–7800)
Platelets: 233 10*3/uL (ref 140–400)
RBC: 4.9 MIL/uL (ref 4.20–5.80)
RDW: 13.5 % (ref 11.0–15.0)
WBC: 9.2 10*3/uL (ref 3.8–10.8)

## 2016-10-12 LAB — LIPID PANEL
CHOL/HDL RATIO: 3.9 ratio (ref <=5.0)
Cholesterol: 157 mg/dL (ref 125–200)
HDL: 40 mg/dL (ref >=40)
LDL Cholesterol: 94 mg/dL (ref <130)
Triglycerides: 116 mg/dL (ref <150)
VLDL: 23 mg/dL (ref <30)

## 2016-10-12 LAB — COMPLETE METABOLIC PANEL WITH GFR
ALT: 33 U/L (ref 9–46)
AST: 24 U/L (ref 10–40)
Albumin: 4 g/dL (ref 3.6–5.1)
Alkaline Phosphatase: 62 U/L (ref 40–115)
BUN: 15 mg/dL (ref 7–25)
CHLORIDE: 98 mmol/L (ref 98–110)
CO2: 25 mmol/L (ref 20–31)
Calcium: 9.2 mg/dL (ref 8.6–10.3)
Creat: 0.93 mg/dL (ref 0.60–1.35)
GFR, Est African American: >89 mL/min (ref >=60)
GLUCOSE: 79 mg/dL (ref 65–99)
POTASSIUM: 4.1 mmol/L (ref 3.5–5.3)
SODIUM: 135 mmol/L (ref 135–146)
Total Bilirubin: 0.6 mg/dL (ref 0.2–1.2)
Total Protein: 7.2 g/dL (ref 6.1–8.1)

## 2016-10-12 LAB — HEMOGLOBIN A1C
HEMOGLOBIN A1C: 4.6 % (ref <5.7)
MEAN PLASMA GLUCOSE: 85 mg/dL

## 2016-10-12 LAB — VITAMIN D 25 HYDROXY (VIT D DEFICIENCY, FRACTURES): Vit D, 25-Hydroxy: 35 ng/mL (ref 30–100)

## 2016-10-12 LAB — TSH: TSH: 2.59 mIU/L (ref 0.40–4.50)

## 2016-10-15 DIAGNOSIS — J4520 Mild intermittent asthma, uncomplicated: Secondary | ICD-10-CM | POA: Insufficient documentation

## 2016-10-15 DIAGNOSIS — R5382 Chronic fatigue, unspecified: Secondary | ICD-10-CM | POA: Insufficient documentation

## 2016-10-15 DIAGNOSIS — J3089 Other allergic rhinitis: Secondary | ICD-10-CM | POA: Insufficient documentation

## 2016-10-15 NOTE — Assessment & Plan Note (Signed)
Above goal. Today's blood pressure 140/97. Counseling done. Dietary and lifestyle modifications discussed. Monitor at home and follow-up blood pressure and fasting labs near future.

## 2016-10-15 NOTE — Assessment & Plan Note (Signed)
Asymptomatic currently. Discussed fall allergies the patient. Over the counter medications discussed.   Follow-up as needed when necessary

## 2016-10-15 NOTE — Assessment & Plan Note (Signed)
Stable, a symptomatic currently. Not using albuterol in many many months.

## 2016-10-15 NOTE — Assessment & Plan Note (Signed)
We'll obtain fasting labs in the near future including TSH, vitamin D, but and B12, A1c.   Did discuss physical deconditioning with patient which is likely cause of his fatigue. Dietary and lifestyle modifications discussed.

## 2016-10-15 NOTE — Assessment & Plan Note (Signed)

## 2016-11-02 ENCOUNTER — Ambulatory Visit (INDEPENDENT_AMBULATORY_CARE_PROVIDER_SITE_OTHER): Payer: BLUE CROSS/BLUE SHIELD | Admitting: Family Medicine

## 2016-11-02 ENCOUNTER — Encounter: Payer: Self-pay | Admitting: Family Medicine

## 2016-11-02 VITALS — BP 137/91 | HR 88 | Ht 73.25 in | Wt 351.2 lb

## 2016-11-02 DIAGNOSIS — R5382 Chronic fatigue, unspecified: Secondary | ICD-10-CM | POA: Diagnosis not present

## 2016-11-02 DIAGNOSIS — E559 Vitamin D deficiency, unspecified: Secondary | ICD-10-CM

## 2016-11-02 DIAGNOSIS — I10 Essential (primary) hypertension: Secondary | ICD-10-CM | POA: Diagnosis not present

## 2016-11-02 DIAGNOSIS — J3089 Other allergic rhinitis: Secondary | ICD-10-CM

## 2016-11-02 DIAGNOSIS — Z7189 Other specified counseling: Secondary | ICD-10-CM

## 2016-11-02 MED ORDER — VITAMIN D3 125 MCG (5000 UT) PO TABS: ORAL_TABLET | ORAL | 12 refills | Status: AC

## 2016-11-02 MED ORDER — LOSARTAN POTASSIUM-HCTZ 100-12.5 MG PO TABS: 1.0000 | ORAL_TABLET | Freq: Every day | ORAL | 1 refills | Status: DC

## 2016-11-02 NOTE — Assessment & Plan Note (Addendum)
-   bmi >er 45  - Patient concerned that his fatigue is related to low T not fact he has severe morbid obesity and is inactive.   Will obtain testosterone level today.

## 2016-11-02 NOTE — Patient Instructions (Addendum)
Patient will come in in near future for first thing a.m. testosterone level.  Bring in log of Bp's - check random times and after sitting 15-2720min- write it down  Try to eat #1 nutrients should be protein- the largest part of your pie graph, then carbohydrates, then fats. Try to eat closer to the amount of calories you're supposed eat within at least a couple 100 calories.   More water- 1/2 wt in oz/water per day  Please realize, EXERCISE IS MEDICINE!  -  American Heart Association [facility]( AHA) guidelines for exercise : If you are in good health, without any medical conditions, you should engage in 150 minutes of moderate intensity aerobic activity per week.  This means you should be huffing and puffing throughout your workout.   Engaging in regular exercise will improve brain function and memory, as well as improve mood, boost immune system and help with weight management.  As well as the other, more well-known effects of exercise such as decreasing blood sugar levels, decreasing blood pressure,  and decreasing bad cholesterol levels/ increasing good cholesterol levels.     -  The AHA strongly endorses consumption of a diet that contains a variety of foods from all the food categories with an emphasis on fruits and vegetables; fat-free and low-fat dairy products; cereal and grain products; legumes and nuts; and fish, poultry, and/or extra lean meats.    Excessive food intake, especially of foods high in saturated and trans fats, sugar, and salt, should be avoided.    Adequate water intake of roughly 1/2 of your weight in pounds, should equal the ounces of water per day you should drink.  So for instance, if you're 200 pounds, that would be 100 ounces of water per day.         Mediterranean Diet  Why follow it? Research shows. . Those who follow the Mediterranean diet have a reduced risk of heart disease  . The diet is associated with a reduced incidence of Parkinson's and Alzheimer's  diseases . People following the diet may have longer life expectancies and lower rates of chronic diseases  . The Dietary Guidelines for Americans recommends the Mediterranean diet as an eating plan to promote health and prevent disease  What Is the Mediterranean Diet?  . Healthy eating plan based on typical foods and recipes of Mediterranean-style cooking . The diet is primarily a plant based diet; these foods should make up a majority of meals   Starches - Plant based foods should make up a majority of meals - They are an important sources of vitamins, minerals, energy, antioxidants, and fiber - Choose whole grains, foods high in fiber and minimally processed items  - Typical grain sources include wheat, oats, barley, corn, brown rice, bulgar, farro, millet, polenta, couscous  - Various types of beans include chickpeas, lentils, fava beans, black beans, white beans   Fruits  Veggies - Large quantities of antioxidant rich fruits & veggies; 6 or more servings  - Vegetables can be eaten raw or lightly drizzled with oil and cooked  - Vegetables common to the traditional Mediterranean Diet include: artichokes, arugula, beets, broccoli, brussel sprouts, cabbage, carrots, celery, collard greens, cucumbers, eggplant, kale, leeks, lemons, lettuce, mushrooms, okra, onions, peas, peppers, potatoes, pumpkin, radishes, rutabaga, shallots, spinach, sweet potatoes, turnips, zucchini - Fruits common to the Mediterranean Diet include: apples, apricots, avocados, cherries, clementines, dates, figs, grapefruits, grapes, melons, nectarines, oranges, peaches, pears, pomegranates, strawberries, tangerines  Fats - Replace butter and margarine with  healthy oils, such as olive oil, canola oil, and tahini  - Limit nuts to no more than a handful a day  - Nuts include walnuts, almonds, pecans, pistachios, pine nuts  - Limit or avoid candied, honey roasted or heavily salted nuts - Olives are central to the Mediterranean  diet - can be eaten whole or used in a variety of dishes   Meats Protein - Limiting red meat: no more than a few times a month - When eating red meat: choose lean cuts and keep the portion to the size of deck of cards - Eggs: approx. 0 to 4 times a week  - Fish and lean poultry: at least 2 a week  - Healthy protein sources include, chicken, [place], lean beef, lamb - Increase intake of seafood such as tuna, salmon, trout, mackerel, shrimp, scallops - Avoid or limit high fat processed meats such as sausage and bacon  Dairy - Include moderate amounts of low fat dairy products  - Focus on healthy dairy such as fat free yogurt, skim milk, low or reduced fat cheese - Limit dairy products higher in fat such as whole or 2% milk, cheese, ice cream  Alcohol - Moderate amounts of red wine is ok  - No more than 5 oz daily for women (all ages) and men older than age 32  - No more than 10 oz of wine daily for men younger than 5965  Other - Limit sweets and other desserts  - Use herbs and spices instead of salt to flavor foods  - Herbs and spices common to the traditional Mediterranean Diet include: basil, bay leaves, chives, cloves, cumin, fennel, garlic, lavender, marjoram, mint, oregano, parsley, pepper, rosemary, sage, savory, sumac, tarragon, thyme   It's not just a diet, it's a lifestyle:  . The Mediterranean diet includes lifestyle factors typical of those in the region  . Foods, drinks and meals are best eaten with others and savored . Daily physical activity is important for overall good health . This could be strenuous exercise like running and aerobics . This could also be more leisurely activities such as walking, housework, yard-work, or taking the stairs . Moderation is the key; a balanced and healthy diet accommodates most foods and drinks . Consider portion sizes and frequency of consumption of certain foods   Meal Ideas & Options:  . Breakfast:  o Whole wheat toast or whole wheat English  muffins with peanut butter & hard boiled egg o Steel cut oats topped with apples & cinnamon and skim milk  o Fresh fruit: banana, strawberries, melon, berries, peaches  o Smoothies: strawberries, bananas, greek yogurt, peanut butter o Low fat greek yogurt with blueberries and granola  o Egg white omelet with spinach and mushrooms o Breakfast couscous: whole wheat couscous, apricots, skim milk, cranberries  . Sandwiches:  o Hummus and grilled vegetables (peppers, zucchini, squash) on whole wheat bread   o Grilled chicken on whole wheat pita with lettuce, tomatoes, cucumbers or tzatziki  o Tuna salad on whole wheat bread: tuna salad made with greek yogurt, olives, red peppers, capers, green onions o Garlic rosemary lamb pita: lamb sauted with garlic, rosemary, salt & pepper; add lettuce, cucumber, greek yogurt to pita - flavor with lemon juice and black pepper  . Seafood:  o Mediterranean grilled salmon, seasoned with garlic, basil, parsley, lemon juice and black pepper o Shrimp, lemon, and spinach whole-grain pasta salad made with low fat greek yogurt  o Seared scallops with lemon orzo  o Seared tuna steaks seasoned salt, pepper, coriander topped with tomato mixture of olives, tomatoes, olive oil, minced garlic, parsley, green onions and cappers  . Meats:  o Herbed greek chicken salad with kalamata olives, cucumber, feta  o Red bell peppers stuffed with spinach, bulgur, lean ground beef (or lentils) & topped with feta   o Kebabs: skewers of chicken, tomatoes, onions, zucchini, squash  o [place] burgers: made with red onions, mint, dill, lemon juice, feta cheese topped with roasted red peppers . Vegetarian o Cucumber salad: cucumbers, artichoke hearts, celery, red onion, feta cheese, tossed in olive oil & lemon juice  o Hummus and whole grain pita points with a greek salad (lettuce, tomato, feta, olives, cucumbers, red onion) o Lentil soup with celery, carrots made with vegetable broth,  garlic, salt and pepper  o Tabouli salad: parsley, bulgur, mint, scallions, cucumbers, tomato, radishes, lemon juice, olive oil, salt and pepper.

## 2016-11-02 NOTE — Progress Notes (Signed)
Assessment and plan:  1. HTN (hypertension), benign   2. Severe obesity (BMI >= 40) (HCC)   3. Chronic fatigue   4. Environmental and seasonal allergies   5. Vitamin D insufficiency   6. Counseling on health promotion and disease prevention     Chronic fatigue - bmi >er 45  - Patient concerned that his fatigue is related to low T not fact he has severe morbid obesity and is inactive.   Will obtain testosterone level today.  Severe obesity (BMI >= 40) (HCC) - Declines referral to bariatrics or nutritional counseling  - Counseling done- advised wt loss.  Use lose it or my fitness pal---> track all food and drinks.   Discussed with patient importance of weight loss to help achieve health goals and how increasing weight, correlates to increasing risk of disease. (or increasing risk of not controlling existing diseases.)  - Weight Watchers and regular weekly meetings highly rec  - Dietary modifications and exercise counseling done.    - Many patient's concerns and questions today regarding diet, healthier lifestyle habits etc. were addressed.  - Pt was in the office today for 40+ minutes, with over 50% time spent in face to face counseling of various medical concerns and in coordination of care  HTN (hypertension), benign BP still above bove goal 130/80 or less.   D/c losartan-->  Hyzaar 100-12.5 started today.  Counseling done.   Dietary and lifestyle modifications discussed.   Monitor at home and follow-up blood pressure and repeat BMP labs near future.  Vitamin D insufficiency - Counseling done regarding medication management and lifestyle modifications.  Counseling on health promotion and disease prevention - All recent labs were reviewed with patient.  All questions were answered.  Additional instructions given to pt on handout:  - Patient will come in in near future for first thing a.m. testosterone  level.  Bring in log of Bp's - check random times and after sitting 15-72mn- write it down  Try to eat #1 nutrients should be protein- the largest part of your pie graph, then carbohydrates, then fats. Try to eat closer to the amount of calories you're supposed eat within at least a couple 100 calories.   More water- 1/2 wt in oz/water per day    New Prescriptions   CHOLECALCIFEROL (VITAMIN D3) 5000 UNITS TABS    5,000 IU OTC vitamin D3 daily.   LOSARTAN-HYDROCHLOROTHIAZIDE (HYZAAR) 100-12.5 MG TABLET    Take 1 tablet by mouth daily.    Modified Medications   No medications on file    Discontinued Medications   LOSARTAN (COZAAR) 100 MG TABLET    Take 1 tablet (100 mg total) by mouth daily.    Return for 4-6 wks- BP f/up new med (bring in log) & Lose It app-Diet/ high protein etc.  Anticipatory guidance and routine counseling done re: condition, txmnt options and need for follow up. All questions of patient's were answered.   Gross side effects, risk and benefits, and alternatives of medications discussed with patient.  Patient is aware that all medications have potential side effects and we are unable to predict every sideeffect or drug-drug interaction that may occur.  Expresses verbal understanding and consents to current therapy plan and treatment regiment.  Please see AVS handed out to patient at the end of our visit for additional patient instructions/ counseling done pertaining to today's office visit.  Note: This document was prepared using [employer] analystand may  include unintentional dictation errors.   ----------------------------------------------------------------------------------------------------------------------  Subjective:   CC:   Jamie Newton is a 30 y.o. male who presents to [place] [facility] today for review and discussion of recent bloodwork that was done 10\25\17. Patient is appointed testosterone level was not  obtained.    1. All recent blood work that we ordered was reviewed with patient today.  Patient was counseled on all abnormalities and we discussed dietary and lifestyle changes that could help those values (also medications when appropriate).  Extensive health counseling performed and all patient's concerns/ questions were addressed.  2.  Bp at home 143/ 93- and right around there. Patient asymptomatic. Trying to watch salt diet admittedly is poor. No exercise.    Wt Readings from Last 3 Encounters:  11/02/16 (!) 351 lb 3.2 oz (159.3 kg)  10/04/16 (!) 359 lb 8 oz (163.1 kg)  02/08/16 (!) 340 lb (154.2 kg)   BP Readings from Last 3 Encounters:  11/02/16 (!) 137/91  10/04/16 (!) 140/97  05/18/14 (!) 105/45   Pulse Readings from Last 3 Encounters:  11/02/16 88  10/04/16 87  05/18/14 72   BMI Readings from Last 3 Encounters:  11/02/16 46.02 kg/m  10/04/16 47.11 kg/m  02/08/16 44.86 kg/m     Patient Care Team    Relationship Specialty Notifications Start End  Mellody Dance, DO PCP - General Family Medicine  11/02/16     Full medical history updated and reviewed in the office today  Patient Active Problem List   Diagnosis Date Noted  . Severe obesity (BMI >= 40) (HCC) 10/15/2016    Priority: High  . HTN (hypertension), benign 10/03/2016    Priority: High  . Mild intermittent asthma, uncomplicated 71/69/6789    Priority: Medium  . Environmental and seasonal allergies 10/15/2016    Priority: Low  . Chronic fatigue 10/15/2016    Priority: Low  . Vitamin D insufficiency 11/19/2016  . Counseling on health promotion and disease prevention 11/19/2016    Past Medical History:  Diagnosis Date  . Allergy    SEASONAL  . Asthma   . Hypertension     Past Surgical History:  Procedure Laterality Date  . APPENDECTOMY      Social History  Substance Use Topics  . Smoking status: Never Smoker  . Smokeless tobacco: Never Used  . Alcohol use 1.2 oz/week    2 Cans of  beer per week    Family Hx: Family History  Problem Relation Age of Onset  . Healthy Brother   . Asthma Daughter   . Arthritis Maternal Grandmother   . Healthy Maternal Grandfather   . Early death Paternal Grandfather     unknown  . Healthy Brother   . Healthy Daughter      Medications: Current Outpatient Prescriptions  Medication Sig Dispense Refill  . acetaminophen (TYLENOL) 650 MG CR tablet Take 650 mg by mouth every 8 (eight) hours as needed for pain.    . Misc Natural Products (OSTEO BI-FLEX JOINT SHIELD) TABS Take 1 tablet by mouth daily.    . Cholecalciferol (VITAMIN D3) 5000 units TABS 5,000 IU OTC vitamin D3 daily. 90 tablet 12  . losartan-hydrochlorothiazide (HYZAAR) 100-12.5 MG tablet Take 1 tablet by mouth daily. 90 tablet 1   No current facility-administered medications for this visit.     Allergies:  Allergies  Allergen Reactions  . Amoxicillin Rash  . Penicillins Rash     ROS: Review of Systems  Constitutional:  Negative.  Negative for chills, diaphoresis, fever, malaise/fatigue and weight loss.  HENT: Negative.  Negative for congestion, sore throat and tinnitus.   Eyes: Negative.  Negative for blurred vision, double vision and photophobia.  Respiratory: Negative.  Negative for cough and wheezing.   Cardiovascular: Negative.  Negative for chest pain and palpitations.  Gastrointestinal: Negative.  Negative for blood in stool, diarrhea, nausea and vomiting.  Genitourinary: Negative.  Negative for dysuria, frequency and urgency.  Musculoskeletal: Positive for joint pain. Negative for myalgias.  Skin: Negative.  Negative for itching and rash.  Neurological: Negative.  Negative for dizziness, focal weakness, weakness and headaches.  Endo/Heme/Allergies: Negative.  Negative for environmental allergies and polydipsia. Does not bruise/bleed easily.  Psychiatric/Behavioral: Negative.  Negative for depression and memory loss. The patient is not nervous/anxious and  does not have insomnia.     Objective:  Blood pressure (!) 137/91, pulse 88, height 6' 1.25" (1.861 m), weight (!) 351 lb 3.2 oz (159.3 kg). Body mass index is 46.02 kg/m. Gen:   Well NAD, A and O *3 HEENT:    [place]/AT, EOMI,  MMM, OP- clr Lungs:   Normal work of breathing. Distant but ECTA B/L, no Wh, rhonchi apprec Heart:   Distant due to habitus, RRR, S1, S2 WNL's, no MRG Abd:   No gross distention Exts:    warm, pink,  Brisk capillary refill, warm and well perfused.  Psych:    No HI/SI, judgement and insight good, Euthymic mood. Full Affect.   Recent Results (from the past 2160 hour(s))  COMPLETE METABOLIC PANEL WITH GFR     Status: None   Collection Time: 10/11/16  8:24 AM  Result Value Ref Range   Sodium 135 135 - 146 mmol/L   Potassium 4.1 3.5 - 5.3 mmol/L   Chloride 98 98 - 110 mmol/L   CO2 25 20 - 31 mmol/L   Glucose, Bld 79 65 - 99 mg/dL   BUN 15 7 - 25 mg/dL   Creat 0.93 0.60 - 1.35 mg/dL   Total Bilirubin 0.6 0.2 - 1.2 mg/dL   Alkaline Phosphatase 62 40 - 115 U/L   AST 24 10 - 40 U/L   ALT 33 9 - 46 U/L   Total Protein 7.2 6.1 - 8.1 g/dL   Albumin 4.0 3.6 - 5.1 g/dL   Calcium 9.2 8.6 - 10.3 mg/dL   GFR, Est African American >89 >=60 mL/min   GFR, Est Non African American >89 >=60 mL/min  Lipid Profile     Status: None   Collection Time: 10/11/16  8:24 AM  Result Value Ref Range   Cholesterol 157 125 - 200 mg/dL   Triglycerides 116 <150 mg/dL   HDL 40 >=40 mg/dL   Total CHOL/HDL Ratio 3.9 <=5.0 Ratio   VLDL 23 <30 mg/dL   LDL Cholesterol 94 <130 mg/dL    Comment:   Total Cholesterol/HDL Ratio:CHD Risk                        Coronary Heart Disease Risk Table                                        Men       Women          1/2 Average Risk              3.4  3.3              Average Risk              5.0        4.4           2X Average Risk              9.6        7.1           3X Average Risk             23.4       11.0 Use the calculated Patient Ratio  above and the CHD Risk table  to determine the patient's CHD Risk.   HgB A1c     Status: None   Collection Time: 10/11/16  8:24 AM  Result Value Ref Range   Hgb A1c MFr Bld 4.6 <5.7 %    Comment:   For the purpose of screening for the presence of diabetes:   <5.7%       Consistent with the absence of diabetes 5.7-6.4 %   Consistent with increased risk for diabetes (prediabetes) >=6.5 %     Consistent with diabetes   This assay result is consistent with a decreased risk of diabetes.   Currently, no consensus exists regarding use of hemoglobin A1c for diagnosis of diabetes in children.   According to American Diabetes Association (ADA) guidelines, hemoglobin A1c <7.0% represents optimal control in non-pregnant diabetic patients. Different metrics may apply to specific patient populations. Standards of Medical Care in Diabetes (ADA).      Mean Plasma Glucose 85 mg/dL  TSH     Status: None   Collection Time: 10/11/16  8:24 AM  Result Value Ref Range   TSH 2.59 0.40 - 4.50 mIU/L  Vitamin D (25 hydroxy)     Status: None   Collection Time: 10/11/16  8:24 AM  Result Value Ref Range   Vit D, 25-Hydroxy 35 30 - 100 ng/mL    Comment: Vitamin D Status           25-OH Vitamin D        Deficiency                <20 ng/mL        Insufficiency         20 - 29 ng/mL        Optimal             > or = 30 ng/mL   For 25-OH Vitamin D testing on patients on D2-supplementation and patients for whom quantitation of D2 and D3 fractions is required, the QuestAssureD 25-OH VIT D, (D2,D3), LC/MS/MS is recommended: order code [id number] (patients > 2 yrs).   CBC w/Diff     Status: None   Collection Time: 10/11/16  8:24 AM  Result Value Ref Range   WBC 9.2 3.8 - 10.8 K/uL   RBC 4.90 4.20 - 5.80 MIL/uL   Hemoglobin 14.5 13.2 - 17.1 g/dL   HCT 42.3 38.5 - 50.0 %   MCV 86.3 80.0 - 100.0 fL   MCH 29.6 27.0 - 33.0 pg   MCHC 34.3 32.0 - 36.0 g/dL   RDW 13.5 11.0 - 15.0 %   Platelets 233 140 - 400 K/uL     MPV 10.1 7.5 - 12.5 fL   Neutro Abs 5,244 1,500 - 7,800 cells/uL   Lymphs Abs 2,944 850 - 3,900 cells/uL   Monocytes  Absolute 920 200 - 950 cells/uL   Eosinophils Absolute 92 15 - 500 cells/uL   Basophils Absolute 0 0 - 200 cells/uL   Neutrophils Relative % 57 %   Lymphocytes Relative 32 %   Monocytes Relative 10 %   Eosinophils Relative 1 %   Basophils Relative 0 %   Smear Review Criteria for review not met   Testosterone Total,Free,Bio, Males     Status: None   Collection Time: 11/02/16  3:21 PM  Result Value Ref Range   Testosterone 450 250 - 827 ng/dL   Albumin 4.5 3.6 - 5.1 g/dL   Sex Hormone Binding 32 10 - 50 nmol/L   Testosterone, Free 61.4 46.0 - 224.0 pg/mL   Testosterone, Bioavailable 126.2 110.0 - 575.0 ng/dL

## 2016-11-03 LAB — TESTOSTERONE TOTAL,FREE,BIO, MALES
ALBUMIN: 4.5 g/dL (ref 3.6–5.1)
SEX HORMONE BINDING: 32 nmol/L (ref 10–50)
TESTOSTERONE BIOAVAILABLE: 126.2 ng/dL (ref 110.0–575.0)
TESTOSTERONE FREE: 61.4 pg/mL (ref 46.0–224.0)
TESTOSTERONE: 450 ng/dL (ref 250–827)

## 2016-11-19 DIAGNOSIS — Z7189 Other specified counseling: Secondary | ICD-10-CM | POA: Insufficient documentation

## 2016-11-19 DIAGNOSIS — E559 Vitamin D deficiency, unspecified: Secondary | ICD-10-CM | POA: Insufficient documentation

## 2016-11-19 NOTE — Assessment & Plan Note (Addendum)
-   Declines referral to bariatrics or nutritional counseling  - Counseling done- advised wt loss.  Use lose it or my fitness pal---> track all food and drinks.   Discussed with patient importance of weight loss to help achieve health goals and how increasing weight, correlates to increasing risk of disease. (or increasing risk of not controlling existing diseases.)  - Weight Watchers and regular weekly meetings highly rec  - Dietary modifications and exercise counseling done.    - Many patient's concerns and questions today regarding diet, healthier lifestyle habits etc. were addressed.  - Pt was in the office today for 40+ minutes, with over 50% time spent in face to face counseling of various medical concerns and in coordination of care

## 2016-11-19 NOTE — Assessment & Plan Note (Signed)
-   Counseling done regarding medication management and lifestyle modifications.

## 2016-11-19 NOTE — Assessment & Plan Note (Addendum)
BP still above bove goal 130/80 or less.   D/c losartan-->  Hyzaar 100-12.5 started today.  Counseling done.   Dietary and lifestyle modifications discussed.   Monitor at home and follow-up blood pressure and repeat BMP labs near future.

## 2016-11-19 NOTE — Assessment & Plan Note (Addendum)
-   All recent labs were reviewed with patient.  All questions were answered.  Additional instructions given to pt on handout:  - Patient will come in in near future for first thing a.m. testosterone level.  Bring in log of Bp's - check random times and after sitting 15-7520min- write it down  Try to eat #1 nutrients should be protein- the largest part of your pie graph, then carbohydrates, then fats. Try to eat closer to the amount of calories you're supposed eat within at least a couple 100 calories.   More water- 1/2 wt in oz/water per day

## 2016-12-13 ENCOUNTER — Ambulatory Visit (INDEPENDENT_AMBULATORY_CARE_PROVIDER_SITE_OTHER): Payer: BLUE CROSS/BLUE SHIELD | Admitting: Family Medicine

## 2016-12-13 DIAGNOSIS — I10 Essential (primary) hypertension: Secondary | ICD-10-CM | POA: Diagnosis not present

## 2016-12-13 DIAGNOSIS — Z5181 Encounter for therapeutic drug level monitoring: Secondary | ICD-10-CM | POA: Diagnosis not present

## 2016-12-13 MED ORDER — AMLODIPINE BESYLATE 5 MG PO TABS: 5.0000 mg | ORAL_TABLET | Freq: Every day | ORAL | 3 refills | Status: DC

## 2016-12-13 NOTE — Patient Instructions (Addendum)
Please read the handout I gave you on weight loss from the national Institute of diabetes and kidney and digestive diseases website.    Also next office visit please bring me in your phone so I can see or log of everything that you been putting in your mouth since today.   Also since he declined to go on additional blood pressure medicine at this time to get your blood pressure to a goal of less than 130/80 on a regular basis, please keep an eye on it at [facility] it with a blood pressure monitor and keep a log of those numbers.   Please bring in next office visit. If you do change your mind and decide that it is best to go on additional BP medicines after our discussion today and my recommendations, please call the office and we'll be happy to send in a medicine to your pharmacy of your choice   Guidelines for Losing Weight   We want weight loss that will last so you should lose 1-2 pounds a week.  THAT IS IT! Please pick THREE things a month to change. Once it is a habit check off the item. Then pick another three items off the list to become habits.  If you are already doing a habit on the list GREAT!  Cross that item off!  Don't drink your calories. Ie, alcohol, soda, fruit juice, and sweet tea.   Drink more water. Drink a glass when you feel hungry or before each meal.   Eat breakfast - Complex carb and protein (likeDannon light and fit yogurt, oatmeal, fruit, eggs, [place] bacon).  Measure your cereal.  Eat no more than one cup a day. (ie Kashi)  Eat an apple a day.  Add a vegetable a day.  Try a new vegetable a month.  Use Pam! Stop using oil or butter to cook.  Don't finish your plate or use smaller plates.  Share your dessert.  Eat sugar free Jello for dessert or frozen grapes.  Don't eat 2-3 hours before bed.  Switch to whole wheat bread, pasta, and brown rice.  Make healthier choices when you eat out. No fries!  Pick baked chicken, NOT fried.  Don't forget to  SLOW DOWN when you eat. It is not going anywhere.   Take the stairs.  Park far away in the parking lot  Lift soup cans (or weights) for 10 minutes while watching TV.  Walk at work for 10 minutes during break.  Walk outside 1 time a week with your friend, kids, dog, or significant other.  Start a walking group at church.  Walk the mall as much as you can tolerate.   Keep a food diary.  Weigh yourself daily.  Walk for 15 minutes 3 days per week.  Cook at home more often and eat out less. If life happens and you go back to old habits, it is okay.  Just start over. You can do it!  If you experience chest pain, get short of breath, or tired during the exercise, please stop immediately and inform your doctor.    Before you even begin to attack a weight-loss plan, it pays to remember this: You are not fat. You have fat. Losing weight isn't about blame or shame; it's simply another achievement to accomplish. Dieting is like any other skill-you have to buckle down and work at it. As long as you act in a smart, reasonable way, you'll ultimately get where you want to be. Here  are some weight loss pearls for you.   1. It's Not a Diet. It's a Lifestyle Thinking of a diet as something you're on and suffering through only for the short term doesn't work. To shed weight and keep it off, you need to make permanent changes to the way you eat. It's OK to indulge occasionally, of course, but if you cut calories temporarily and then revert to your old way of eating, you'll gain back the weight quicker than you can say yo-yo. Use it to lose it. Research shows that one of the best predictors of long-term weight loss is how many pounds you drop in the first month. For that reason, nutritionists often suggest being stricter for the first two weeks of your new eating strategy to build momentum. Cut out added sugar and alcohol and avoid unrefined carbs. After that, figure out how you can reincorporate them in a  way that's healthy and maintainable.  2. There's a Right Way to Exercise Working out burns calories and fat and boosts your metabolism by building muscle. But those trying to lose weight are notorious for overestimating the number of calories they burn and underestimating the amount they take in. Unfortunately, your system is biologically programmed to hold on to extra pounds and that means when you start exercising, your body senses the deficit and ramps up its hunger signals. If you're not diligent, you'll eat everything you burn and then some. Use it, to lose it. Cardio gets all the exercise glory, but strength and interval training are the real heroes. They help you build lean muscle, which in turn increases your metabolism and calorie-burning ability 3. Don't Overreact to Mild Hunger Some people have a hard time losing weight because of hunger anxiety. To them, being hungry is bad-something to be avoided at all costs-so they carry snacks with them and eat when they don't need to. Others eat because they're stressed out or bored. While you never want to get to the point of being ravenous (that's when bingeing is likely to happen), a hunger pang, a craving, or the fact that it's 3:00 p.m. should not send you racing for the vending machine or obsessing about the energy bar in your purse. Ideally, you should put off eating until your stomach is growling and it's difficult to concentrate.  Use it to lose it. When you feel the urge to eat, use the HALT method. Ask yourself, Am I really hungry? Or am I angry or anxious, lonely or bored, or tired? If you're still not certain, try the apple test. If you're truly hungry, an apple should seem delicious; if it doesn't, something else is going on. Or you can try drinking water and making yourself busy, if you are still hungry try a healthy snack.  4. Not All Calories Are Created Equal The mechanics of weight loss are pretty simple: Take in fewer calories than you  use for energy. But the kind of food you eat makes all the difference. Processed food that's high in saturated fat and refined starch or sugar can cause inflammation that disrupts the hormone signals that tell your brain you're full. The result: You eat a lot more.  Use it to lose it. Clean up your diet. Swap in whole, unprocessed foods, including vegetables, lean protein, and healthy fats that will fill you up and give you the biggest nutritional bang for your calorie buck. In a few weeks, as your brain starts receiving regular hunger and fullness signals once again,  you'll notice that you feel less hungry overall and naturally start cutting back on the amount you eat.  5. Protein, Produce, and Plant-Based Fats Are Your Weight-Loss Trinity Here's why eating the three Ps regularly will help you drop pounds. Protein fills you up. You need it to build lean muscle, which keeps your metabolism humming so that you can torch more fat. People in a weight-loss program who ate double the recommended daily allowance for protein (about 110 grams for a 150-pound woman) lost 70 percent of their weight from fat, while people who ate the RDA lost only about 40 percent, one study found. Produce is packed with filling fiber. "It's very difficult to consume too many calories if you're eating a lot of vegetables. Example: Three cups of broccoli is a lot of food, yet only 93 calories. (Fruit is another story. It can be easy to overeat and can contain a lot of calories from sugar, so be sure to monitor your intake.) Plant-based fats like olive oil and those in avocados and nuts are healthy and extra satiating.  Use it to lose it. Aim to incorporate each of the three Ps into every meal and snack. People who eat protein throughout the day are able to keep weight off, according to a study in the American Journal of Clinical Nutrition. In addition to meat, poultry and seafood, good sources are beans, lentils, eggs, tofu, and yogurt.  As for fat, keep portion sizes in check by measuring out salad dressing, oil, and nut butters (shoot for one to two tablespoons). Finally, eat veggies or a little fruit at every meal. People who did that consumed 308 fewer calories but didn't feel any hungrier than when they didn't eat more produce.  7. How You Eat Is As Important As What You Eat In order for your brain to register that you're full, you need to focus on what you're eating. Sit down whenever you eat, preferably at a table. Turn off the TV or computer, put down your phone, and look at your food. Smell it. Chew slowly, and don't put another bite on your fork until you swallow. When women ate lunch this attentively, they consumed 30 percent less when snacking later than those who listened to an audiobook at lunchtime, according to a study in the [place] Journal of Nutrition. 8. Weighing Yourself Really Works The scale provides the best evidence about whether your efforts are paying off. Seeing the numbers tick up or down or stagnate is motivation to keep going-or to rethink your approach. A 2015 study at [facility] found that daily weigh-ins helped people lose more weight, keep it off, and maintain that loss, even after two years. Use it to lose it. Step on the scale at the same time every day for the best results. If your weight shoots up several pounds from one weigh-in to the next, don't freak out. Eating a lot of salt the night before or having your period is the likely culprit. The number should return to normal in a day or two. It's a steady climb that you need to do something about. 9. Too Much Stress and Too Little Sleep Are Your Enemies When you're tired and frazzled, your body cranks up the production of cortisol, the stress hormone that can cause carb cravings. Not getting enough sleep also boosts your levels of ghrelin, a hormone associated with hunger, while suppressing leptin, a hormone that signals fullness and satiety.  People on a diet who slept only five  and a half hours a night for two weeks lost 55 percent less fat and were hungrier than those who slept eight and a half hours, according to a study in the [place] [employer] Journal. Use it to lose it. Prioritize sleep, aiming for seven hours or more a night, which research shows helps lower stress. And make sure you're getting quality zzz's. If a snoring spouse or a fidgety cat wakes you up frequently throughout the night, you may end up getting the equivalent of just four hours of sleep, according to a study from [facility]. Keep pets out of the bedroom, and use a Newton-noise app to drown out snoring. 10. You Will Hit a plateau-And You Can Bust Through It As you slim down, your body releases much less leptin, the fullness hormone.  If you're not strength training, start right now. Building muscle can raise your metabolism to help you overcome a plateau. To keep your body challenged and burning calories, incorporate new moves and more intense intervals into your workouts or add another sweat session to your weekly routine. Alternatively, cut an extra 100 calories or so a day from your diet. Now that you've lost weight, your body simply doesn't need as much fuel.      Since food equals calories, in order to lose weight you must either eat fewer calories, exercise more to burn off calories with activity, or both. Food that is not used to fuel the body is stored as fat. A major component of losing weight is to make smarter food choices. Here's how:  1)   Limit non-nutritious foods, such as: Sugar, honey, syrups and candy Pastries, donuts, pies, cakes and cookies Soft drinks, sweetened juices and alcoholic beverages  2)  Cut down on high-fat foods by: - Choosing poultry, fish or lean red meat - Choosing low-fat cooking methods, such as baking, broiling, steaming, grilling and boiling - Using low-fat or non-fat dairy products - Using  vinaigrette, herbs, lemon or fat-free salad dressings - Avoiding fatty meats, such as bacon, sausage, franks, ribs and luncheon meats - Avoiding high-fat snacks like nuts, chips and chocolate - Avoiding fried foods - Using less butter, margarine, oil and mayonnaise - Avoiding high-fat gravies, cream sauces and cream-based soups  3) Eat a variety of foods, including: - Fruit and vegetables that are raw, steamed or baked - Whole grains, breads, cereal, rice and pasta - Dairy products, such as low-fat or non-fat milk or yogurt, low-fat cottage cheese and low-fat cheese - Protein-rich foods like chicken, [place], fish, lean meat and legumes, or beans  4) Change your eating habits by: - Eat three balanced meals a day to help control your hunger - Watch portion sizes and eat small servings of a variety of foods - Choose low-calorie snacks - Eat only when you are hungry and stop when you are satisfied - Eat slowly and try not to perform other tasks while eating - Find other activities to distract you from food, such as walking, taking up a hobby or being involved in the community - Include regular exercise in your daily routine ( minimum of 20 min of moderate-intensity exercise at least 5 days/week)  - Find a support group, if necessary, for emotional support in your weight loss journey      Easy ways to cut 100 calories  1. Eat your eggs with hot sauce OR salsa instead of cheese.  Eggs are great for breakfast, but many people consider eggs and cheese to  be BFFs. Instead of cheese-1 oz. of cheddar has 114 calories-top your eggs with hot sauce, which contains no calories and helps with satiety and metabolism. Salsa is also a great option!!  2. Top your toast, waffles or pancakes with fresh berries instead of jelly or syrup. Half a cup of berries-fresh, frozen or thawed-has about 40 calories, compared with 2 tbsp. of maple syrup or jelly, which both have about 100 calories. The berries will also  give you a good punch of fiber, which helps keep you full and satisfied and won't spike blood sugar quickly like the jelly or syrup. 3. Swap the non-fat latte for black coffee with a splash of half-and-half. Contrary to its name, that non-fat latte has 130 calories and a startling 19g of carbohydrates per 16 oz. serving. Replacing that 'light' drinkable dessert with a black coffee with a splash of half-and-half saves you more than 100 calories per 16 oz. serving. 4. Sprinkle salads with freeze-dried raspberries instead of dried cranberries. If you want a sweet addition to your nutritious salad, stay away from dried cranberries. They have a whopping 130 calories per  cup and 30g carbohydrates. Instead, sprinkle freeze-dried raspberries guilt-free and save more than 100 calories per  cup serving, adding 3g of belly-filling fiber. 5. Go for mustard in place of mayo on your sandwich. Mustard can add really nice flavor to any sandwich, and there are tons of varieties, from spicy to honey. A serving of mayo is 95 calories, versus 10 calories in a serving of mustard.  Or try an avocado mayo spread: You can find the recipe few click this link: https://www.californiaavocado.com/recipes/recipe-container/[place]-avocado-mayo 6. Choose a DIY salad dressing instead of the store-bought kind. Mix Dijon or whole grain mustard with low-fat Kefir or red wine vinegar and garlic. 7. Use hummus as a spread instead of a dip. Use hummus as a spread on a high-fiber cracker or tortilla with a sandwich and save on calories without sacrificing taste. 8. Pick just one salad "accessory." Salad isn't automatically a calorie winner. It's easy to over-accessorize with toppings. Instead of topping your salad with nuts, avocado and cranberries (all three will clock in at 313 calories), just pick one. The next day, choose a different accessory, which will also keep your salad interesting. You don't wear all your jewelry every day,  right? 9. Ditch the Newton pasta in favor of spaghetti squash. One cup of cooked spaghetti squash has about 40 calories, compared with traditional spaghetti, which comes with more than 200. Spaghetti squash is also nutrient-dense. It's a good source of fiber and Vitamins A and C, and it can be eaten just like you would eat pasta-with a great tomato sauce and [place] meatballs or with pesto, tofu and spinach, for example. 10. Dress up your chili, soups and stews with non-fat [place] yogurt instead of sour cream. Just a 'dollop' of sour cream can set you back 115 calories and a whopping 12g of fat-seven of which are of the artery-clogging variety. Added bonus: [place] yogurt is packed with muscle-building protein, calcium and B Vitamins. 11. Mash cauliflower instead of mashed potatoes. One cup of traditional mashed potatoes-in all their creamy goodness-has more than 200 calories, compared to mashed cauliflower, which you can typically eat for less than 100 calories per 1 cup serving. Cauliflower is a great source of the antioxidant indole-3-carbinol (I3C), which may help reduce the risk of some cancers, like breast cancer. 12. Ditch the ice cream sundae in favor of a [place] yogurt parfait.  Instead of a cup of ice cream or fro-yo for dessert, try 1 cup of nonfat Greek yogurt topped with fresh berries and a sprinkle of cacao nibs. Both toppings are packed with antioxidants, which can help reduce cellular inflammation and oxidative damage. And the comparison is a no-brainer: One cup of ice cream has about 275 calories; one cup of frozen yogurt has about 230; and a cup of Greek yogurt has just 130, plus twice the protein, so you're less likely to return to the freezer for a second helping. 13. Put olive oil in a spray container instead of using it directly from the bottle. Each tablespoon of olive oil is 120 calories and 15g of fat. Use a mister instead of pouring it straight into the pan or onto a salad. This allows  for portion control and will save you more than 100 calories. 14. When baking, substitute canned pumpkin for butter or oil. Canned pumpkin-not pumpkin pie mix-is loaded with Vitamin A, which is important for skin and eye health, as well as immunity. And the comparisons are pretty crazy:  cup of canned pumpkin has about 40 calories, compared to butter or oil, which has more than 800 calories. Yes, 800 calories. Applesauce and mashed banana can also serve as good substitutions for butter or oil, usually in a 1:1 ratio. 15. Top casseroles with high-fiber cereal instead of breadcrumbs. Breadcrumbs are typically made with [place] bread, while breakfast cereals contain 5-9g of fiber per serving. Not only will you save more than 150 calories per  cup serving, the swap will also keep you more full and you'll get a metabolism boost from the added fiber. 16. Snack on pistachios instead of macadamia nuts. Believe it or not, you get the same amount of calories from 35 pistachios (100 calories) as you would from only five macadamia nuts. 17. Chow down on kale chips rather than potato chips. This is my favorite 'don't knock it 'till you try it' swap. Kale chips are so easy to make at home, and you can spice them up with a little grated parmesan or chili powder. Plus, they're a mere fraction of the calories of potato chips, but with the same crunch factor we crave so often. 18. Add seltzer and some fruit slices to your cocktail instead of soda or fruit juice. One cup of soda or fruit juice can pack on as much as 140 calories. Instead, use seltzer and fruit slices. The fruit provides valuable phytochemicals, such as flavonoids and anthocyanins, which help to combat cancer and stave off the aging process.      Phentermine  While taking the medication we may ask that you come into the office once a month or once every 2-3 months to monitor your weight, blood pressure, and heart rate. In addition we can help answer  your questions about diet, exercise, and help you every step of the way with your weight loss journey. Sometime it is helpful if you bring in a food diary or use an app on your phone such as myfitnesspal to record your calorie intake, especially in the beginning.   You can start out on 1/3 to 1/2 a pill in the morning and if you are tolerating it well you can increase to one pill daily. I also have some patients that take 1/3 or 1/2 at lunch to help prevent night time eating.  [facility] is 14-17 dollars and you do NOT  need a membership to get meds from there.    What is this medicine? PHENTERMINE (FEN ter meen) decreases your appetite. This medicine is intended to be used in addition to a healthy reduced calorie diet and exercise. The best results are achieved this way. This medicine is only indicated for short-term use. Eventually your weight loss may level out and the medication will no longer be needed.   How should I use this medicine? Take this medicine by mouth. Follow the directions on the prescription label. The tablets should stay in the bottle until immediately before you take your dose. Take your doses at regular intervals. Do not take your medicine more often than directed.  Overdosage: If you think you have taken too much of this medicine contact a poison control center or emergency room at once. NOTE: This medicine is only for you. Do not share this medicine with others.  What if I miss a dose? If you miss a dose, take it as soon as you can. If it is almost time for your next dose, take only that dose. Do not take double or extra doses. Do not increase or in any way change your dose without consulting your doctor.  What should I watch for while using this medicine? Notify your physician immediately if you become short of breath while doing your normal activities. Do not take this medicine within 6 hours of  bedtime. It can keep you from getting to sleep. Avoid drinks that contain caffeine and try to stick to a regular bedtime every night. Do not stand or sit up quickly, especially if you are an older patient. This reduces the risk of dizzy or fainting spells. Avoid alcoholic drinks.  What side effects may I notice from receiving this medicine? Side effects that you should report to your doctor or health care professional as soon as possible: -chest pain, palpitations -depression or severe changes in mood -increased blood pressure -irritability -nervousness or restlessness -severe dizziness -shortness of breath -problems urinating -unusual swelling of the legs -vomiting  Side effects that usually do not require medical attention (report to your doctor or health care professional if they continue or are bothersome): -blurred vision or other eye problems -changes in sexual ability or desire -constipation or diarrhea -difficulty sleeping -dry mouth or unpleasant taste -headache -nausea This list may not describe all possible side effects. Call your doctor for medical advice about side effects. You may report side effects to FDA at 1-800-FDA-1088.

## 2016-12-13 NOTE — Progress Notes (Signed)
Impression and Recommendations:    1. Severe obesity (BMI >= 40) (HCC)   2. HTN (hypertension), benign   3. Medication monitoring encounter    HTN (hypertension), benign BP still above bove goal 130/80 or less.   Cont.  Hyzaar 100-12.5;  Add Amlodipine today.  Counseling done on R/B meds, precautions etc.   Dietary and lifestyle modifications discussed.   Monitor at home and follow-up blood pressure- bring in log  Severe obesity (BMI >= 40) (HCC) Again declines bariatric referral/ wt loss  - declines nutrition c/s  - rec [employer]  - track foods in lose it.  Pt tells me he barely eats anything, but asked him to please track it  - importance water d/c pt   Please read the handout I gave you on weight loss from the national Institute of diabetes and kidney and digestive diseases website.    Also next office visit please bring me in your phone so I can see or log of everything that you been putting in your mouth since today.   Also since he declined to go on additional blood pressure medicine at this time to get your blood pressure to a goal of less than 130/80 on a regular basis, please keep an eye on it at [facility] it with a blood pressure monitor and keep a log of those numbers.   Please bring in next office visit. If you do change your mind and decide that it is best to go on additional BP medicines after our discussion today and my recommendations, please call the office and we'll be happy to send in a medicine to your pharmacy of your choice  Education and routine counseling performed. Handouts provided.   New Prescriptions   AMLODIPINE (NORVASC) 5 MG TABLET    Take 1 tablet (5 mg total) by mouth daily.    Return in about 3 weeks (around 01/03/2017) for f/up BP, recheck - added [place].  The patient was counseled, risk factors were discussed, anticipatory guidance given.  Gross side effects, risk and benefits, and alternatives of medications discussed with  patient.  Patient is aware that all medications have potential side effects and we are unable to predict every side effect or drug-drug interaction that may occur.  Expresses verbal understanding and consents to current therapy plan and treatment regimen.  Please see AVS handed out to patient at the end of our visit for further patient instructions/ counseling done pertaining to today's office visit.    Note: This document was prepared using Dragon voice recognition software and may include unintentional dictation errors.     Subjective:    Chief Complaint  Patient presents with  . Follow-up  . Hypertension    HPI: Jamie Newton is a 30 y.o. male who presents to [facility] at [facility] today for follow up for HTN after changing meds and to review lose it app/ food diary.    HTN: Home BP readings have been running in the 130-140's/  80-90's ( never over 100) per pt.   Last OV: D/c losartan-->  Hyzaar 100-12.5 started.    Pt has been tolerating meds well.   Taking as prescribed.   Denies HA, dizziness, CP, SOB, Visual changes, increasing pedal edema.     Wt:   walking for about each day, 1-3 times per week - going to the gym again- wt lifting, cardio;  Salt Restriction: some;  Doing more protein, eatign a  little more. But not doing lose it or tracking foods.  Did not bring in log.     Patient Care Team    Relationship Specialty Notifications Start End  Thomasene Loteborah Davyon Fisch, DO PCP - General Family Medicine  11/02/16      Wt Readings from Last 3 Encounters:  12/28/16 (!) 367 lb (166.5 kg)  12/13/16 (!) 369 lb 8 oz (167.6 kg)  11/02/16 (!) 351 lb 3.2 oz (159.3 kg)    BP Readings from Last 3 Encounters:  12/28/16 125/81  12/13/16 (!) 136/96  11/02/16 (!) 137/91    Pulse Readings from Last 3 Encounters:  12/28/16 86  12/13/16 87  11/02/16 88    BMI Readings from Last 3 Encounters:  12/28/16 48.09 kg/m  12/13/16 48.42 kg/m  11/02/16 46.02 kg/m      Lab Results  Component Value Date   CREATININE 0.93 10/11/2016   BUN 15 10/11/2016   NA 135 10/11/2016   K 4.1 10/11/2016   CL 98 10/11/2016   CO2 25 10/11/2016    Lab Results  Component Value Date   CHOL 157 10/11/2016    Lab Results  Component Value Date   HDL 40 10/11/2016    Lab Results  Component Value Date   LDLCALC 94 10/11/2016    Lab Results  Component Value Date   TRIG 116 10/11/2016    Lab Results  Component Value Date   CHOLHDL 3.9 10/11/2016    No results found for: LDLDIRECT ===================================================================  Patient Active Problem List   Diagnosis Date Noted  . Severe obesity (BMI >= 40) (HCC) 10/15/2016    Priority: High  . HTN (hypertension), benign 10/03/2016    Priority: High  . Mild intermittent asthma, uncomplicated 10/15/2016    Priority: Medium  . Environmental and seasonal allergies 10/15/2016    Priority: Low  . Chronic fatigue 10/15/2016    Priority: Low  . Medication monitoring encounter 01/10/2017  . Vitamin D insufficiency 11/19/2016  . Counseling on health promotion and disease prevention 11/19/2016    Past Medical History:  Diagnosis Date  . Allergy    SEASONAL  . Asthma   . Hypertension     Past Surgical History:  Procedure Laterality Date  . APPENDECTOMY      Family History  Problem Relation Age of Onset  . Healthy Brother   . Asthma Daughter   . Arthritis Maternal Grandmother   . Healthy Maternal Grandfather   . Early death Paternal Grandfather     unknown  . Healthy Brother   . Healthy Daughter     History  Drug Use No  ,  History  Alcohol Use  . 1.2 oz/week  . 2 Cans of beer per week  ,  History  Smoking Status  . Never Smoker  Smokeless Tobacco  . Never Used  ,    Current Outpatient Prescriptions on File Prior to Visit  Medication Sig Dispense Refill  . Cholecalciferol (VITAMIN D3) 5000 units TABS 5,000 IU OTC vitamin D3 daily. 90 tablet 12   . losartan-hydrochlorothiazide (HYZAAR) 100-12.5 MG tablet Take 1 tablet by mouth daily. 90 tablet 1  . Misc Natural Products (OSTEO BI-FLEX JOINT SHIELD) TABS Take 1 tablet by mouth daily.    Marland Kitchen. acetaminophen (TYLENOL) 650 MG CR tablet Take 650 mg by mouth every 8 (eight) hours as needed for pain.     No current facility-administered medications on file prior to visit.     Allergies  Allergen Reactions  .  Amoxicillin Rash  . Penicillins Rash    Review of Systems  Constitutional: Negative for diaphoresis and weight loss.  HENT: Negative for nosebleeds.   Eyes: Negative for blurred vision and double vision.  Respiratory: Negative for shortness of breath and wheezing.   Cardiovascular: Negative for chest pain, palpitations, orthopnea and claudication.  Gastrointestinal: Negative for diarrhea, nausea and vomiting.  Musculoskeletal: Negative for falls and myalgias.  Skin: Negative for rash.  Neurological: Negative for dizziness and focal weakness.  Endo/Heme/Allergies: Negative for polydipsia.  Psychiatric/Behavioral: Negative for memory loss.    Objective:   Blood pressure (!) 136/96, pulse 87, height 6' 1.25" (1.861 m), weight (!) 369 lb 8 oz (167.6 kg). Body mass index is 48.42 kg/m. General: Well Developed, well nourished, and in no acute distress.  HEENT: Normocephalic, atraumatic, pupils equal round reactive to light, neck supple, No carotid bruits, no JVD Skin: Warm and dry, cap RF less 2 sec Cardiac: Regular rate and rhythm, S1, S2 WNL's, no murmurs rubs or gallops Respiratory: ECTA B/L, Not using accessory muscles, speaking in full sentences. NeuroM-Sk: Ambulates w/o assistance, moves ext * 4 w/o difficulty, sensation grossly intact.  Ext: scant edema b/l lower ext Psych: No HI/SI, judgement and insight good, Euthymic mood. Full Affect.

## 2016-12-28 ENCOUNTER — Ambulatory Visit (INDEPENDENT_AMBULATORY_CARE_PROVIDER_SITE_OTHER): Payer: BLUE CROSS/BLUE SHIELD | Admitting: Family Medicine

## 2016-12-28 ENCOUNTER — Encounter: Payer: Self-pay | Admitting: Family Medicine

## 2016-12-28 VITALS — BP 125/81 | HR 86 | Resp 17 | Wt 367.0 lb

## 2016-12-28 DIAGNOSIS — J4520 Mild intermittent asthma, uncomplicated: Secondary | ICD-10-CM | POA: Diagnosis not present

## 2016-12-28 DIAGNOSIS — R5382 Chronic fatigue, unspecified: Secondary | ICD-10-CM

## 2016-12-28 DIAGNOSIS — Z7189 Other specified counseling: Secondary | ICD-10-CM

## 2016-12-28 DIAGNOSIS — Z23 Encounter for immunization: Secondary | ICD-10-CM | POA: Diagnosis not present

## 2016-12-28 DIAGNOSIS — I10 Essential (primary) hypertension: Secondary | ICD-10-CM

## 2016-12-28 MED ORDER — PHENTERMINE HCL 37.5 MG PO TABS: 37.5000 mg | ORAL_TABLET | Freq: Every day | ORAL | 1 refills | Status: DC

## 2016-12-28 NOTE — Patient Instructions (Addendum)
Please track your weight. Write it down.  I recommend that you lose 5% of your weight in the first month on your own.   This will ensure that her eating right and then if you can achieve that then start the phentermine the start of the second month.    When you start to take the phentermine, write down your starting weight.   Please note you should lose 5% of your weight over the next 4 weeks.   If you don't that is not worth the risks for you to take the medicines.    How to Take Your Blood Pressure HOW DO I GET A BLOOD PRESSURE MACHINE?  You can buy an electronic home blood pressure machine at your local pharmacy. Insurance will sometimes cover the cost if you have a prescription.  Ask your doctor what type of machine is best for you. There are different machines for your arm and your wrist.  If you decide to buy a machine to check your blood pressure on your arm, first check the size of your arm so you can buy the right size cuff. To check the size of your arm:   Use a measuring tape that shows both inches and centimeters.   Wrap the measuring tape around the upper-middle part of your arm. You may need someone to help you measure.   Write down your arm measurement in both inches and centimeters.   To measure your blood pressure correctly, it is important to have the right size cuff.   If your arm is up to 13 inches (up to 34 centimeters), get an adult cuff size.  If your arm is 13 to 17 inches (35 to 44 centimeters), get a large adult cuff size.    If your arm is 17 to 20 inches (45 to 52 centimeters), get an adult thigh cuff.  WHAT DO THE NUMBERS MEAN?   There are two numbers that make up your blood pressure. For example: 120/80.  The first number (120 in our example) is called the "systolic pressure." It is a measure of the pressure in your blood vessels when your heart is pumping blood.  The second number (80 in our example) is called the "diastolic pressure." It is a  measure of the pressure in your blood vessels when your heart is resting between beats.  Your doctor will tell you what your blood pressure should be. WHAT SHOULD I DO BEFORE I CHECK MY BLOOD PRESSURE?   Try to rest or relax for at least 30 minutes before you check your blood pressure.  Do not smoke.  Do not have any drinks with caffeine, such as:  Soda.  Coffee.  Tea.  Check your blood pressure in a quiet room.  Sit down and stretch out your arm on a table. Keep your arm at about the level of your heart. Let your arm relax.  Make sure that your legs are not crossed. HOW DO I CHECK MY BLOOD PRESSURE?  Follow the directions that came with your machine.  Make sure you remove any tight-fitting clothing from your arm or wrist. Wrap the cuff around your upper arm or wrist. You should be able to fit a finger between the cuff and your arm. If you cannot fit a finger between the cuff and your arm, it is too tight and should be removed and rewrapped.  Some units require you to manually pump up the arm cuff.  Automatic units inflate the cuff when you  press a button.  Cuff deflation is automatic in both models.  After the cuff is inflated, the unit measures your blood pressure and pulse. The readings are shown on a monitor. Hold still and breathe normally while the cuff is inflated.  Getting a reading takes less than a minute.  Some models store readings in a memory. Some provide a printout of readings. If your machine does not store your readings, keep a written record.  Take readings with you to your next visit with your doctor. This information is not intended to replace advice given to you by your health care provider. Make sure you discuss any questions you have with your health care provider. Document Released: 11/16/2008 Document Revised: 12/25/2014 Document Reviewed: 01/29/2014 Elsevier Interactive Patient Education  2017 [employer]

## 2016-12-28 NOTE — Progress Notes (Signed)
Impression and Recommendations:    1. Need for Tdap vaccination   2. Severe obesity (BMI >= 40) (HCC)   3. HTN (hypertension), benign   4. Mild intermittent asthma, uncomplicated   5. Chronic fatigue   6. Counseling on health promotion and disease prevention     Severe obesity (BMI >= 40) (HCC) Patient insists on taking phentermine to help him get a jump start on weight loss.  He understands risks and benefits of this medicine and still wishes to proceed with it.  Please track your weight. Write it down.    I recommend that you lose 5% of your weight in the first month on your own.   This will ensure that you're eating right and then if you can achieve that then start the phentermine the start of the second month.      When you start to take the phentermine, write down your starting weight.   Please note you should lose 5% of your weight over the next 4 weeks.   If you don't that is not worth the risks for you to take the medicines.  HTN (hypertension), benign Better controlled today.  Continue calcium channel blocker and Hyzaar.   Continue to monitor at home to a goal of less than 130/80 on a regular basis.     Education and routine counseling performed. Handouts provided.   New Prescriptions   PHENTERMINE (ADIPEX-P) 37.5 MG TABLET    Take 1 tablet (37.5 mg total) by mouth daily before breakfast.    Modified Medications   No medications on file    Discontinued Medications   No medications on file     Orders Placed This Encounter  Procedures  . Tdap vaccine greater than or equal to 7yo IM  . Ambulatory referral to Nutrition and Diabetic Education     Return in about 4 weeks (around 01/25/2017) for wt check 4 wks w/  Tonya, then in another 4 wks.  The patient was counseled, risk factors were discussed, anticipatory guidance given.  Gross side effects, risk and benefits, and alternatives of medications discussed with patient.  Patient is aware that all  medications have potential side effects and we are unable to predict every side effect or drug-drug interaction that may occur.  Expresses verbal understanding and consents to current therapy plan and treatment regimen.  Please see AVS handed out to patient at the end of our visit for further patient instructions/ counseling done pertaining to today's office visit.    Note: This document was prepared using Dragon voice recognition software and may include unintentional dictation errors.     Subjective:    Chief Complaint  Patient presents with  . Hypertension    HPI: Jamie Newton is a 31 y.o. male who presents to [facility] at [facility] today for follow up for HTN.    Today:  Bp-- Last office visit we started patient on amlodipine in addition to his Hyzaar  At work bp was:  129/85,  128/86,  130/85  takign 1 tab daily.  He is tolerating his new medicine well.  Blood pressure today in the office is better controlled.   Wt loss-- He has been using lsoe it daily since last OV.   He is either under calories or not charting at all.  His primary food source is fat and carbs not protein.   still trying to walk daily.   Feel strongly that he needs a weight  loss medicine to jump start him.  He would like to discuss this today.     Patient Care Team    Relationship Specialty Notifications Start End  Thomasene Lot, DO PCP - General Family Medicine  11/02/16      Wt Readings from Last 3 Encounters:  12/28/16 (!) 367 lb (166.5 kg)  12/13/16 (!) 369 lb 8 oz (167.6 kg)  11/02/16 (!) 351 lb 3.2 oz (159.3 kg)    BP Readings from Last 3 Encounters:  12/28/16 125/81  12/13/16 (!) 136/96  11/02/16 (!) 137/91    Pulse Readings from Last 3 Encounters:  12/28/16 86  12/13/16 87  11/02/16 88    BMI Readings from Last 3 Encounters:  12/28/16 48.09 kg/m  12/13/16 48.42 kg/m  11/02/16 46.02 kg/m     Lab Results  Component Value Date   CREATININE 0.93  10/11/2016   BUN 15 10/11/2016   NA 135 10/11/2016   K 4.1 10/11/2016   CL 98 10/11/2016   CO2 25 10/11/2016    Lab Results  Component Value Date   CHOL 157 10/11/2016    Lab Results  Component Value Date   HDL 40 10/11/2016    Lab Results  Component Value Date   LDLCALC 94 10/11/2016    Lab Results  Component Value Date   TRIG 116 10/11/2016    Lab Results  Component Value Date   CHOLHDL 3.9 10/11/2016    No results found for: LDLDIRECT ===================================================================  Patient Active Problem List   Diagnosis Date Noted  . Severe obesity (BMI >= 40) (HCC) 10/15/2016    Priority: High  . HTN (hypertension), benign 10/03/2016    Priority: High  . Mild intermittent asthma, uncomplicated 10/15/2016    Priority: Medium  . Environmental and seasonal allergies 10/15/2016    Priority: Low  . Chronic fatigue 10/15/2016    Priority: Low  . Medication monitoring encounter 01/10/2017  . Vitamin D insufficiency 11/19/2016  . Counseling on health promotion and disease prevention 11/19/2016    Past Medical History:  Diagnosis Date  . Allergy    SEASONAL  . Asthma   . Hypertension     Past Surgical History:  Procedure Laterality Date  . APPENDECTOMY      Family History  Problem Relation Age of Onset  . Healthy Brother   . Asthma Daughter   . Arthritis Maternal Grandmother   . Healthy Maternal Grandfather   . Early death Paternal Grandfather     unknown  . Healthy Brother   . Healthy Daughter     History  Drug Use No  ,  History  Alcohol Use  . 1.2 oz/week  . 2 Cans of beer per week  ,  History  Smoking Status  . Never Smoker  Smokeless Tobacco  . Never Used  ,    Current Outpatient Prescriptions on File Prior to Visit  Medication Sig Dispense Refill  . acetaminophen (TYLENOL) 650 MG CR tablet Take 650 mg by mouth every 8 (eight) hours as needed for pain.    Marland Kitchen amLODipine (NORVASC) 5 MG tablet Take 1  tablet (5 mg total) by mouth daily. 90 tablet 3  . Cholecalciferol (VITAMIN D3) 5000 units TABS 5,000 IU OTC vitamin D3 daily. 90 tablet 12  . losartan-hydrochlorothiazide (HYZAAR) 100-12.5 MG tablet Take 1 tablet by mouth daily. 90 tablet 1  . Misc Natural Products (OSTEO BI-FLEX JOINT SHIELD) TABS Take 1 tablet by mouth daily.     No current  facility-administered medications on file prior to visit.     Allergies  Allergen Reactions  . Amoxicillin Rash  . Penicillins Rash    Review of Systems  Constitutional: Negative for diaphoresis and weight loss.  HENT: Negative for nosebleeds.   Eyes: Negative for blurred vision and double vision.  Respiratory: Negative for shortness of breath and wheezing.   Cardiovascular: Negative for chest pain, palpitations, orthopnea and claudication.  Gastrointestinal: Negative for diarrhea, nausea and vomiting.  Musculoskeletal: Negative for falls and myalgias.  Skin: Negative for rash.  Neurological: Negative for dizziness and focal weakness.  Endo/Heme/Allergies: Negative for polydipsia.  Psychiatric/Behavioral: Negative for memory loss.    Objective:   Blood pressure 125/81, pulse 86, resp. rate 17, weight (!) 367 lb (166.5 kg), SpO2 95 %. Body mass index is 48.09 kg/m. General: Well Developed, well nourished, and in no acute distress.  HEENT: Normocephalic, atraumatic, pupils equal round reactive to light, neck supple, No carotid bruits, no JVD Skin: Warm and dry, cap RF less 2 sec Cardiac: Regular rate and rhythm, S1, S2 WNL's, no murmurs rubs or gallops Respiratory: ECTA B/L, Not using accessory muscles, speaking in full sentences. NeuroM-Sk: Ambulates w/o assistance, moves ext * 4 w/o difficulty, sensation grossly intact.  Ext: scant edema b/l lower ext Psych: No HI/SI, judgement and insight good, Euthymic mood. Full Affect.

## 2017-01-10 DIAGNOSIS — Z5181 Encounter for therapeutic drug level monitoring: Secondary | ICD-10-CM | POA: Insufficient documentation

## 2017-01-10 NOTE — Assessment & Plan Note (Signed)
BP still above bove goal 130/80 or less.   Cont.  Hyzaar 100-12.5;  Add Amlodipine today.  Counseling done on R/B meds, precautions etc.   Dietary and lifestyle modifications discussed.   Monitor at home and follow-up blood pressure- bring in log

## 2017-01-10 NOTE — Assessment & Plan Note (Signed)
Patient insists on taking phentermine to help him get a jump start on weight loss.  He understands risks and benefits of this medicine and still wishes to proceed with it.  Please track your weight. Write it down.    I recommend that you lose 5% of your weight in the first month on your own.   This will ensure that you're eating right and then if you can achieve that then start the phentermine the start of the second month.      When you start to take the phentermine, write down your starting weight.   Please note you should lose 5% of your weight over the next 4 weeks.   If you don't that is not worth the risks for you to take the medicines.

## 2017-01-10 NOTE — Assessment & Plan Note (Signed)
Again declines bariatric referral/ wt loss  - declines nutrition c/s  - rec [employer]  - track foods in lose it.  Pt tells me he barely eats anything, but asked him to please track it  - importance water d/c pt

## 2017-01-10 NOTE — Assessment & Plan Note (Signed)
Better controlled today.  Continue calcium channel blocker and Hyzaar.   Continue to monitor at home to a goal of less than 130/80 on a regular basis.

## 2017-02-22 ENCOUNTER — Ambulatory Visit: Payer: BLUE CROSS/BLUE SHIELD | Admitting: Family Medicine

## 2017-05-21 ENCOUNTER — Other Ambulatory Visit: Payer: Self-pay | Admitting: Family Medicine

## 2017-09-18 IMAGING — MR MR ELBOW*R* W/O CM
5 series · 40 of 40 positions shown · non-contrast
Comparison: None.

CLINICAL DATA: Posterior right elbow swelling with limited range of
motion intermittently for 5 years. No known injury. Initial
encounter.

EXAM:
MRI OF THE RIGHT ELBOW WITHOUT CONTRAST
TECHNIQUE: Multiplanar, multisequence MR imaging of the elbow was performed. No
intravenous contrast was administered.

[Series 4: T1 · axial · right · 3.0mm · 0.47mm/px · z∈[-32,+57]mm · 9 of 29 slices shown]
[im 1/29]
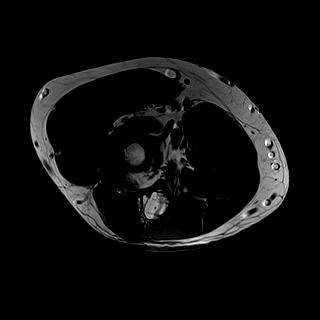
[im 4/29]
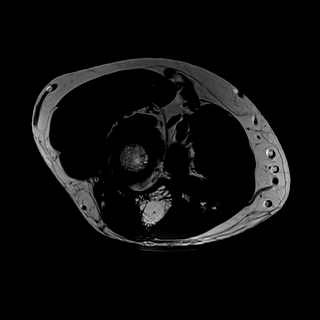
[im 8/29]
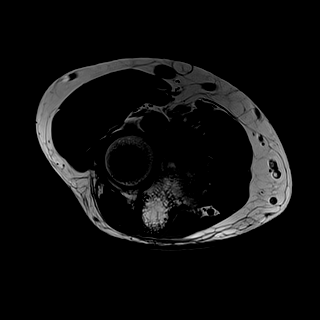
[im 11/29]
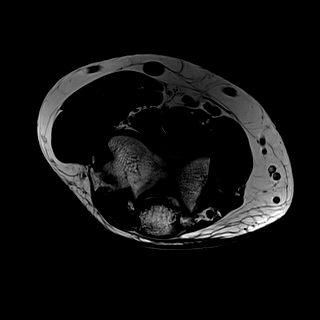
[im 15/29]
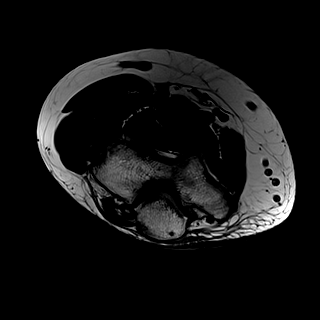
[im 18/29]
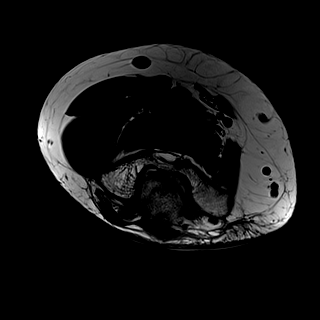
[im 22/29]
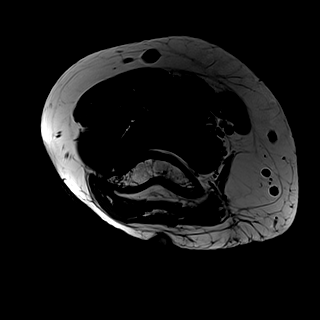
[im 25/29]
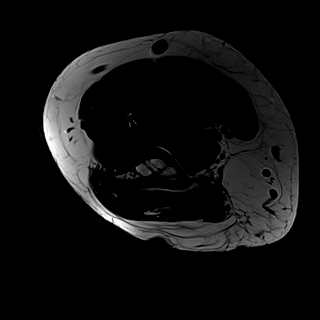
[im 29/29]
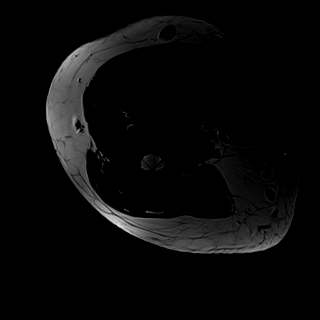

[Series 5: T2 fat-sat · axial · right · 3.0mm · 0.47mm/px · z∈[-32,+57]mm · 8 of 29 slices shown (1 of 2)]
[im 1/29]
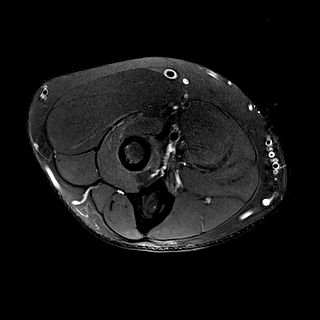
[im 5/29]
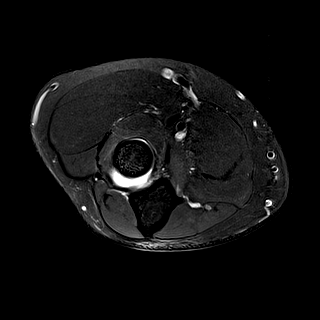
[im 9/29]
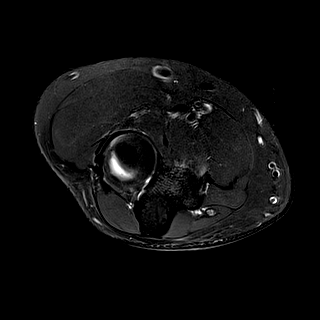
[im 13/29]
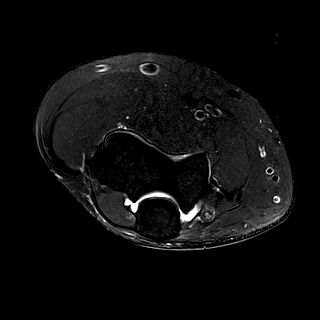
[im 17/29]
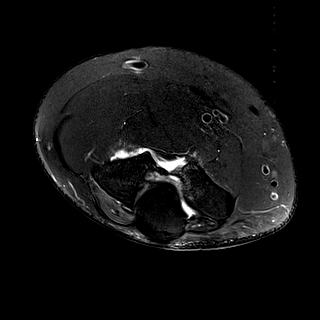
[im 21/29]
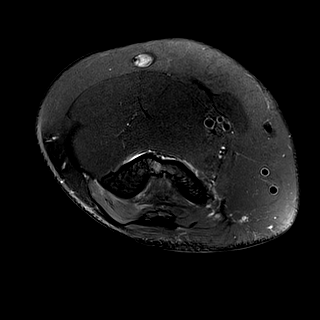
[im 25/29]
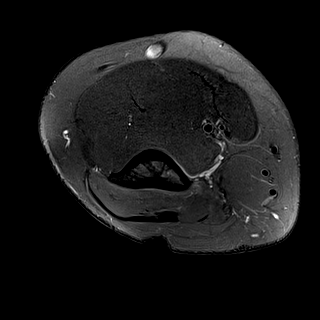
[im 29/29]
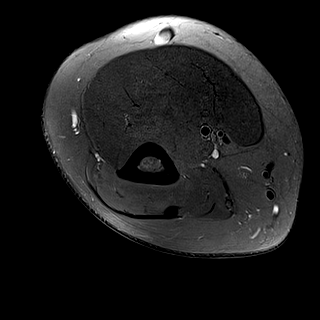

[Series 6: T2 fat-sat · coronal · right · 3.0mm · 0.44mm/px · 8 of 29 slices shown (2 of 2)]
[im 1/29]
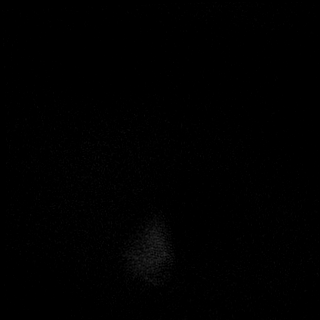
[im 5/29]
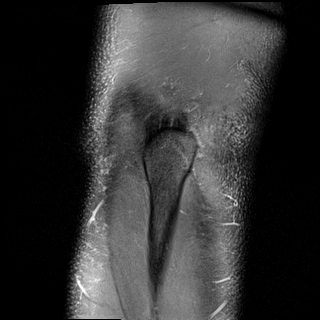
[im 9/29]
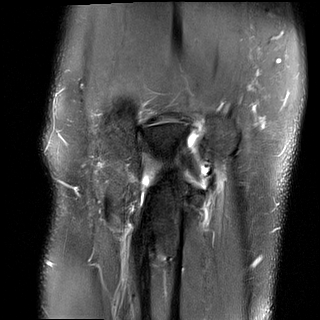
[im 13/29]
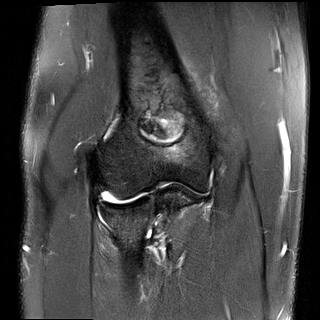
[im 17/29]
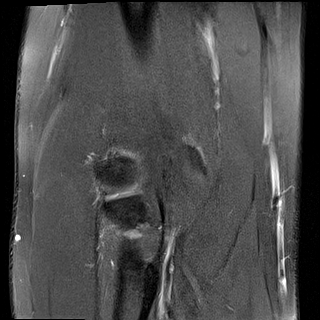
[im 21/29]
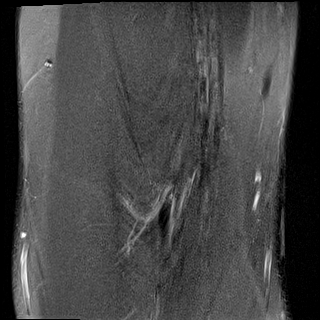
[im 25/29]
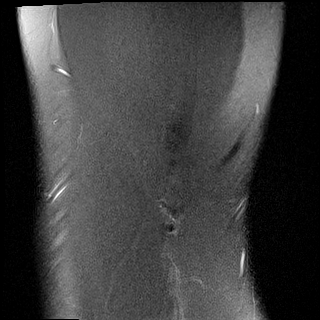
[im 29/29]
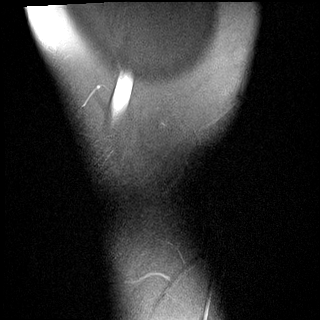

[Series 7: PD fat-sat · sagittal · right · 3.5mm · 0.36mm/px · 7 of 26 slices shown]
[im 1/26]
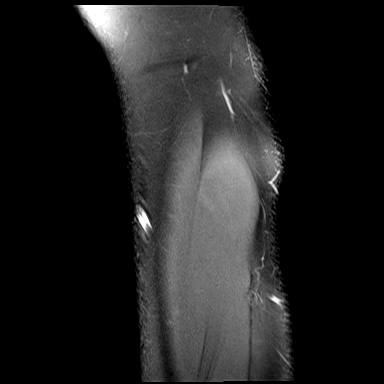
[im 5/26]
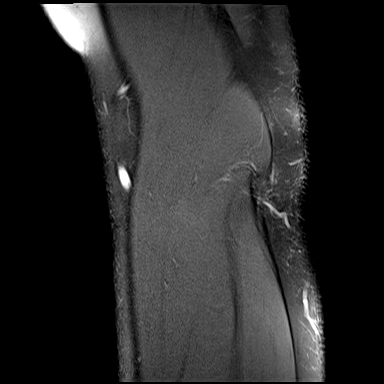
[im 9/26]
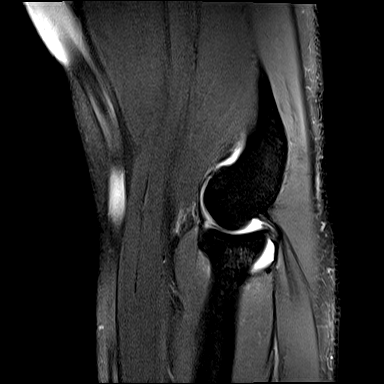
[im 13/26]
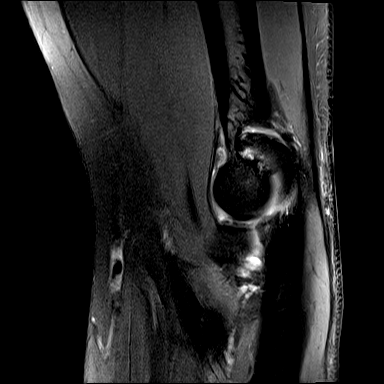
[im 17/26]
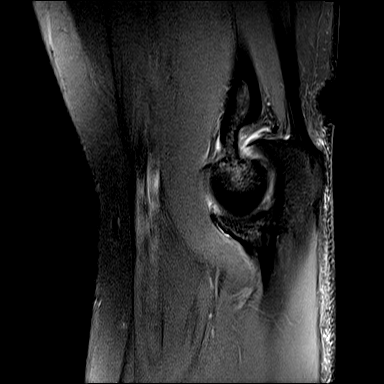
[im 21/26]
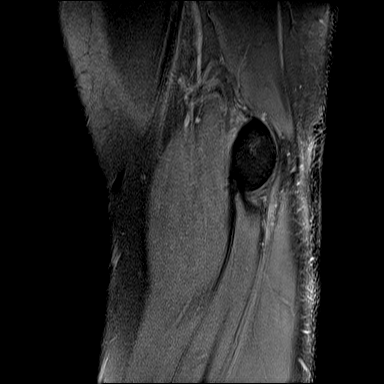
[im 26/26]
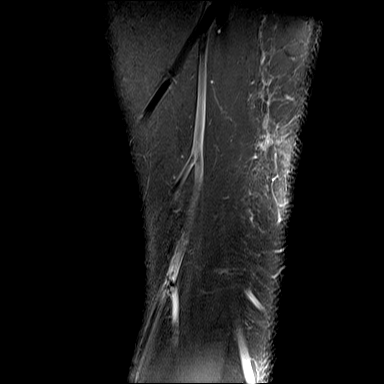

[Series 8: STIR · coronal · right · 3.0mm · 0.55mm/px · 8 of 29 slices shown]
[im 1/29]
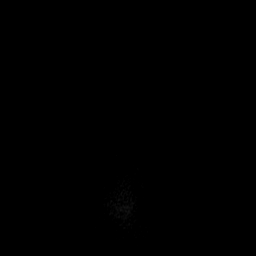
[im 5/29]
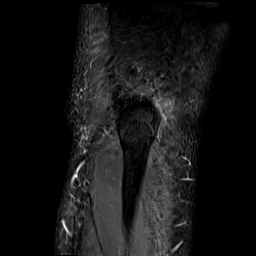
[im 9/29]
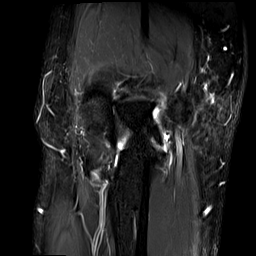
[im 13/29]
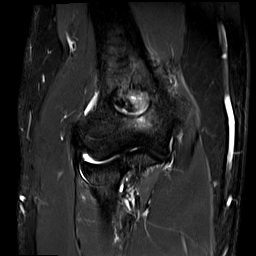
[im 17/29]
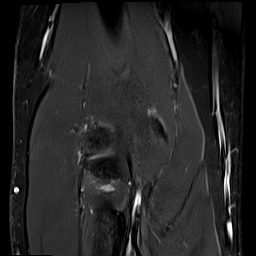
[im 21/29]
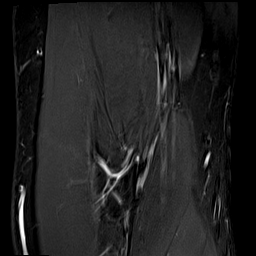
[im 25/29]
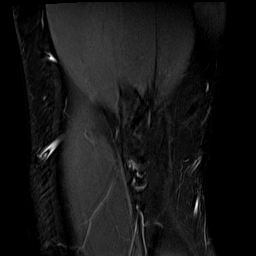
[im 29/29]
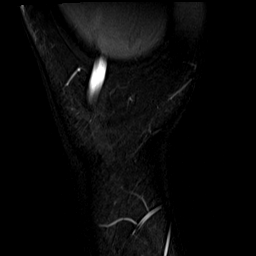

[40 of 40 positions shown; findings below may reference images not displayed]

FINDINGS: A marker is placed in the region of concern over the dorsal aspect
of the elbow at the level of the distal triceps tendon. No
underlying abnormality is identified.

TENDONS

Common forearm flexor origin: Intact.

Common forearm extensor origin: Intact.

Biceps: Intact.

Triceps: Intact.

LIGAMENTS

Medial stabilizers: Intact.

Lateral stabilizers:  Intact.

Cartilage: There is some hyaline cartilage loss about the elbow most
notable along the trochlea and coronoid process.

Joint: Small joint effusion.

Cubital tunnel: Appears normal.

Bones: Mild marrow edema is seen in the coronoid process. No
fracture or worrisome marrow lesion.
IMPRESSION: No abnormality is identified in the region of concern.

Findings most consistent with early degenerative disease about the
articulation of the ulna and humerus.

Negative for ligament or tendon abnormality.

## 2017-09-19 ENCOUNTER — Telehealth: Payer: Self-pay | Admitting: Family Medicine

## 2017-09-19 NOTE — Telephone Encounter (Signed)
Please advise.  T. Chanti Golubski, CMA 

## 2017-09-19 NOTE — Telephone Encounter (Signed)
Patient is out of his blood pressure meds and needs a refill. He has not been back in the office in quite some time because he has got a work promotion that keeps him out of town except on the weekends, and will likely continue until December. He does not want to go that long without his BP meds, but I explained that he would probably need to be seen for any additional refills. He wants me to send this message and see what his options are.

## 2017-09-19 NOTE — Telephone Encounter (Signed)
Will need ov and f/up for RF's of any of his meds -   Pt was told to f/up 4 wks back in Jan and pt's blood pressure had been poorly controlled up until the very last time I saw him because we started a new blood pressure medicine just rpior.  He will need labs and this issue reevaluated.       I recommend he go to a local urgent care or something that is open on weekends and start to use them as his PCP if he is unable to get to [place] as indicated and needed for prudent f/up of his medical issues.

## 2017-09-20 ENCOUNTER — Telehealth: Payer: Self-pay | Admitting: Family Medicine

## 2017-09-20 NOTE — Telephone Encounter (Signed)
Patient states received a call frm medical assistant Tonya & was returning her call. --glh

## 2017-09-20 NOTE — Telephone Encounter (Signed)
LVM for pt to call to discuss.  T. Wonder Donaway, CMA  

## 2017-09-21 NOTE — Telephone Encounter (Signed)
LVM for pt to call to discuss.  T. Kaylany Tesoriero, CMA  

## 2017-09-21 NOTE — Telephone Encounter (Signed)
Pt informed of recommendations.  Pt expressed understanding and is agreeable.  T. Nelson, CMA 

## 2017-09-21 NOTE — Telephone Encounter (Signed)
See original phone notes.  Tiajuana Amass, CMA

## 2019-07-21 ENCOUNTER — Other Ambulatory Visit: Payer: Self-pay

## 2019-07-21 DIAGNOSIS — Z20822 Contact with and (suspected) exposure to covid-19: Secondary | ICD-10-CM

## 2019-07-22 LAB — NOVEL CORONAVIRUS, NAA: SARS-CoV-2, NAA: DETECTED — AB

## 2021-01-18 ENCOUNTER — Ambulatory Visit: Payer: BC Managed Care – PPO | Admitting: Physician Assistant

## 2021-01-21 ENCOUNTER — Ambulatory Visit: Payer: BC Managed Care – PPO | Admitting: Physician Assistant

## 2021-03-09 ENCOUNTER — Encounter: Payer: Self-pay | Admitting: Physician Assistant

## 2021-03-09 ENCOUNTER — Other Ambulatory Visit: Payer: Self-pay

## 2021-03-09 ENCOUNTER — Ambulatory Visit (INDEPENDENT_AMBULATORY_CARE_PROVIDER_SITE_OTHER): Payer: BC Managed Care – PPO | Admitting: Physician Assistant

## 2021-03-09 VITALS — BP 165/118 | HR 96 | Temp 98.2°F | Ht 74.0 in | Wt >= 6400 oz

## 2021-03-09 DIAGNOSIS — M2550 Pain in unspecified joint: Secondary | ICD-10-CM | POA: Diagnosis not present

## 2021-03-09 DIAGNOSIS — Z7689 Persons encountering health services in other specified circumstances: Secondary | ICD-10-CM | POA: Diagnosis not present

## 2021-03-09 DIAGNOSIS — I10 Essential (primary) hypertension: Secondary | ICD-10-CM

## 2021-03-09 MED ORDER — DICLOFENAC SODIUM 75 MG PO TBEC: 75.0000 mg | DELAYED_RELEASE_TABLET | Freq: Two times a day (BID) | ORAL | 0 refills | Status: DC

## 2021-03-09 MED ORDER — NEBIVOLOL HCL 5 MG PO TABS: 5.0000 mg | ORAL_TABLET | Freq: Every day | ORAL | 0 refills | Status: DC

## 2021-03-09 MED ORDER — LOSARTAN POTASSIUM-HCTZ 100-12.5 MG PO TABS: 1.0000 | ORAL_TABLET | Freq: Every day | ORAL | 0 refills | Status: DC

## 2021-03-09 NOTE — Progress Notes (Unsigned)
New Patient Office Visit  Subjective:  Patient ID: Jamie Newton, male    DOB: [birth year]  Age: 35 y.o. MRN: [id number]  CC:  Chief Complaint  Patient presents with  . New Patient (Initial Visit)  . Hypertension  . Tendonitis    HPI NEVILLE WALSTON presents to reestablish care. 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 Reports did not establish with a new PCP in [place], went to the urgent care beside [facility] for medication refills. Patient is currently taking Losartan-HCTZ 50 mg-12.5 mg and Nebivolol 5 mg for HTN and takes Diclofenac 75 mg as needed for tendonitis in multiple joints. Denies new medical diagnosis or surgeries. No changes to existing medical history. Has been out of blood pressure medications. Does not check BP at home. Denies chest pain, palpitations, dizziness, headache, lower extremity swelling or vision changes. Reports good hydration. Patient also interested on weight loss options. In the past tried phentermine which worked to suppress appetite and has also tried My Fitness Pal but has not been able to maintain weight. Is on his feet at work and gets in about 20,000-30,000 steps/day.  Past Medical History:  Diagnosis Date  . Allergy    SEASONAL  . Asthma   . Hypertension     Past Surgical History:  Procedure Laterality Date  . APPENDECTOMY      Family History  Problem Relation Age of Onset  . Healthy Brother   . Asthma Daughter   . Arthritis Maternal Grandmother   . Healthy Maternal Grandfather   . Early death Paternal Grandfather        unknown  . Healthy Brother   . Healthy Daughter     Social History   Socioeconomic History  . Marital status: Married    Spouse name: Tax inspector  . Number of children: 2  . Years of education: Not on file  . Highest education level: Not on file  Occupational History  . Occupation: Company secretary: [email or web address]  Tobacco Use  . Smoking  status: Never Smoker  . Smokeless tobacco: Never Used  Substance and Sexual Activity  . Alcohol use: Yes    Alcohol/week: 1.0 standard drink    Types: 1 Cans of beer per week  . Drug use: No  . Sexual activity: Yes  Other Topics Concern  . Not on file  Social History Narrative  . Not on file   Social Determinants of Health   Financial Resource Strain: Not on file  Food Insecurity: Not on file  Transportation Needs: Not on file  Physical Activity: Not on file  Stress: Not on file  Social Connections: Not on file  Intimate Partner Violence: Not on file    ROS Review of Systems A fourteen system review of systems was performed and found to be positive as per HPI.  Objective:   Today's Vitals: BP (!) 165/118 (BP Location: Left Arm, Patient Position: Sitting, Cuff Size: Large)   Pulse 96   Temp 98.2 F (36.8 C) (Temporal)   Ht 6\' 2"  (1.88 m)   Wt (!) 425 lb 8 oz (193 kg)   SpO2 96%   BMI 54.63 kg/m   Physical Exam General:  Well Developed, well nourished, in no acute distress, non-toxic appearing. Neuro:  Alert and oriented,  extra-ocular muscles intact, CN II-XII grossly intact  HEENT:  Normocephalic, atraumatic, neck supple, no carotid bruits appreciated  Skin:  no gross rash, warm, pink. Cardiac:  RRR, S1 S2 wnl's, (distant heart sounds)  Respiratory:  ECTA B/L w/o wheezing, decreased breath sounds, Not using accessory muscles, speaking in full sentences- unlabored. Vascular:  Ext warm, no cyanosis apprec.; cap RF less 2 sec. No edema  Psych:  No HI/SI, judgement and insight good, Euthymic mood. Full Affect.   Assessment & Plan:   Problem List Items Addressed This Visit      Cardiovascular and Mediastinum   HTN (hypertension), benign (Chronic)   Relevant Medications   losartan-hydrochlorothiazide (HYZAAR) 100-12.5 MG tablet   nebivolol (BYSTOLIC) 5 MG tablet    Other Visit Diagnoses    Encounter to establish care    -  Primary   Polyarthralgia        Relevant Medications   diclofenac (VOLTAREN) 75 MG EC tablet   Morbid obesity (HCC)         Hypertension: -BP is significantly elevated likely related to being w/o medication for a month. Patient is asymptomatic. Recommend increasing Losartan-HCTZ to 100-12.5 mg and continue nebivolol 5 mg. Patient is agreeable. -Continue good hydration and follow low sodium diet. -Follow up in 4 weeks to reassess HTN and medication therapy. -Will plan to collect CMP at follow up visit.  Polyarthralgia: -Discussed with patient potential risks vs benefits of NSAIDs and advised cautious use due to hypertension. Recommend to resume antihypertensive regimen before taking diclofenac. Continue Voltaren gel as needed. -Will collect CMP at follow up visit.  Morbid obesity: -Patient has tried dietary and lifestyle changes and phentermine in the past with minimal long term success. -Discussed with patient management options including long term medications and will further discuss at follow up visit. Encourage to resume dietary and lifestyle modifications- increase physical activity.  -Weight loss will also benefit hypertension control.    Outpatient Encounter Medications as of 03/09/2021  Medication Sig  . acetaminophen (TYLENOL) 650 MG CR tablet Take 650 mg by mouth every 8 (eight) hours as needed for pain.  . Cholecalciferol (VITAMIN D3) 5000 units TABS 5,000 IU OTC vitamin D3 daily.  . Misc Natural Products (OSTEO BI-FLEX JOINT SHIELD) TABS Take 1 tablet by mouth daily.  . nebivolol (BYSTOLIC) 5 MG tablet Take 1 tablet (5 mg total) by mouth daily.  . phentermine (ADIPEX-P) 37.5 MG tablet Take 1 tablet (37.5 mg total) by mouth daily before breakfast.  . [DISCONTINUED] amLODipine (NORVASC) 5 MG tablet Take 1 tablet (5 mg total) by mouth daily.  . [DISCONTINUED] losartan-hydrochlorothiazide (HYZAAR) 100-12.5 MG tablet TAKE 1 TABLET BY MOUTH DAILY.  Marland Kitchen diclofenac (VOLTAREN) 75 MG EC tablet Take 1 tablet (75 mg  total) by mouth 2 (two) times daily.  Marland Kitchen losartan-hydrochlorothiazide (HYZAAR) 100-12.5 MG tablet Take 1 tablet by mouth daily.  . [DISCONTINUED] diclofenac (VOLTAREN) 75 MG EC tablet Take 75 mg by mouth 2 (two) times daily.   No facility-administered encounter medications on file as of 03/09/2021.    Follow-up: Return in about 4 weeks (around 04/06/2021) for HTN.   Mayer Masker, PA-C

## 2021-03-09 NOTE — Patient Instructions (Signed)

## 2021-04-06 ENCOUNTER — Encounter: Payer: Self-pay | Admitting: Physician Assistant

## 2021-04-06 ENCOUNTER — Other Ambulatory Visit: Payer: Self-pay

## 2021-04-06 ENCOUNTER — Ambulatory Visit: Payer: BC Managed Care – PPO | Admitting: Physician Assistant

## 2021-04-06 VITALS — BP 158/85 | HR 84 | Temp 97.6°F | Ht 74.0 in | Wt >= 6400 oz

## 2021-04-06 DIAGNOSIS — I10 Essential (primary) hypertension: Secondary | ICD-10-CM | POA: Diagnosis not present

## 2021-04-06 DIAGNOSIS — Z0000 Encounter for general adult medical examination without abnormal findings: Secondary | ICD-10-CM | POA: Diagnosis not present

## 2021-04-06 NOTE — Progress Notes (Signed)
Established Patient Office Visit  Subjective:  Patient ID: ADE STMARIE, male    DOB: August 07, 1986  Age: 35 y.o. MRN: [id number]  CC:  Chief Complaint  Patient presents with  . Hypertension    HPI Jamie Newton presents for follow up on hypertension. Patient reports is currently upset due to credit card scam discovered at work. Patient reports medication compliance. Denies chest pain, palpitations, headache, dyspnea, dizziness, or lower extremity swelling. Does not check BP at home. Reports a month ago purchased a row machine which he uses 5 days/wk for 30 minutes and is also meal prepping to help with weight loss.    Past Medical History:  Diagnosis Date  . Allergy    SEASONAL  . Asthma   . Hypertension     Past Surgical History:  Procedure Laterality Date  . APPENDECTOMY      Family History  Problem Relation Age of Onset  . Healthy Brother   . Asthma Daughter   . Arthritis Maternal Grandmother   . Healthy Maternal Grandfather   . Early death Paternal Grandfather        unknown  . Healthy Brother   . Healthy Daughter     Social History   Socioeconomic History  . Marital status: Married    Spouse name: Insurance claims handler  . Number of children: 2  . Years of education: Not on file  . Highest education level: Not on file  Occupational History  . Occupation: Dentist: [employer]  Tobacco Use  . Smoking status: Never Smoker  . Smokeless tobacco: Never Used  Substance and Sexual Activity  . Alcohol use: Yes    Alcohol/week: 1.0 standard drink    Types: 1 Cans of beer per week  . Drug use: No  . Sexual activity: Yes  Other Topics Concern  . Not on file  Social History Narrative  . Not on file   Social Determinants of Health   Financial Resource Strain: Not on file  Food Insecurity: Not on file  Transportation Needs: Not on file  Physical Activity: Not on file  Stress: Not on file  Social Connections: Not on file  Intimate Partner  Violence: Not on file    Outpatient Medications Prior to Visit  Medication Sig Dispense Refill  . acetaminophen (TYLENOL) 650 MG CR tablet Take 650 mg by mouth every 8 (eight) hours as needed for pain.    . Cholecalciferol (VITAMIN D3) 5000 units TABS 5,000 IU OTC vitamin D3 daily. 90 tablet 12  . diclofenac (VOLTAREN) 75 MG EC tablet Take 1 tablet (75 mg total) by mouth 2 (two) times daily. 60 tablet 0  . losartan-hydrochlorothiazide (HYZAAR) 100-12.5 MG tablet Take 1 tablet by mouth daily. 90 tablet 0  . Misc Natural Products (OSTEO BI-FLEX JOINT SHIELD) TABS Take 1 tablet by mouth daily.    . nebivolol (BYSTOLIC) 5 MG tablet Take 1 tablet (5 mg total) by mouth daily. 90 tablet 0   No facility-administered medications prior to visit.    Allergies  Allergen Reactions  . Amoxicillin Rash  . Penicillins Rash    ROS Review of Systems A fourteen system review of systems was performed and found to be positive as per HPI.   Objective:    Physical Exam General:  Well Developed, well nourished, in no acute distress, non-toxic appearing  Neuro:  Alert and oriented,  extra-ocular muscles intact  HEENT:  Normocephalic, atraumatic, neck supple Skin:  no gross rash,  warm, pink. Cardiac:  RRR, S1 S2, distant heart sounds due to body habitus  Respiratory:  ECTA B/L w/o wheezing, crackles or rales, dec breath sounds due to body habitus.  Not using accessory muscles, speaking in full sentences- unlabored. Vascular:  Ext warm, no cyanosis apprec.; cap RF less 2 sec. Psych:  No HI/SI, judgement and insight good, Euthymic mood. Full Affect.   BP (!) 169/109   Pulse 84   Temp 97.6 F (36.4 C)   Ht $R'6\' 2"'GX$  (1.88 m)   Wt (!) 422 lb 12.8 oz (191.8 kg)   SpO2 92%   BMI 54.28 kg/m  Wt Readings from Last 3 Encounters:  04/06/21 (!) 422 lb 12.8 oz (191.8 kg)  03/09/21 (!) 425 lb 8 oz (193 kg)  12/28/16 (!) 367 lb (166.5 kg)     Health Maintenance Due  Topic Date Due  . Hepatitis C  Screening  Never done  . COVID-19 Vaccine (1) Never done  . HIV Screening  Never done    There are no preventive care reminders to display for this patient.  Lab Results  Component Value Date   TSH 2.59 10/11/2016   Lab Results  Component Value Date   WBC 9.2 10/11/2016   HGB 14.5 10/11/2016   HCT 42.3 10/11/2016   MCV 86.3 10/11/2016   PLT 233 10/11/2016   Lab Results  Component Value Date   NA 135 10/11/2016   K 4.1 10/11/2016   CO2 25 10/11/2016   GLUCOSE 79 10/11/2016   BUN 15 10/11/2016   CREATININE 0.93 10/11/2016   BILITOT 0.6 10/11/2016   ALKPHOS 62 10/11/2016   AST 24 10/11/2016   ALT 33 10/11/2016   PROT 7.2 10/11/2016   ALBUMIN 4.5 11/02/2016   CALCIUM 9.2 10/11/2016   Lab Results  Component Value Date   CHOL 157 10/11/2016   Lab Results  Component Value Date   HDL 40 10/11/2016   Lab Results  Component Value Date   LDLCALC 94 10/11/2016   Lab Results  Component Value Date   TRIG 116 10/11/2016   Lab Results  Component Value Date   CHOLHDL 3.9 10/11/2016   Lab Results  Component Value Date   HGBA1C 4.6 10/11/2016      Assessment & Plan:   Problem List Items Addressed This Visit      Cardiovascular and Mediastinum   HTN (hypertension), benign - Primary (Chronic)   Relevant Orders   Comp Met (CMET)   CBC w/Diff   HgB A1c   TSH   Direct LDL    Other Visit Diagnoses    Morbid obesity ([place])       Relevant Orders   Comp Met (CMET)   CBC w/Diff   HgB A1c   TSH   Direct LDL   Healthcare maintenance       Relevant Orders   Comp Met (CMET)   CBC w/Diff   HgB A1c   TSH   Direct LDL     Hypertension: -BP has improved since last visit but continues to be above goal. Situational stress reaction could be contributing to BP. Advised patient to follow up in 2 weeks to reassess BP and if continues to be >135/85 then recommend treatment adjustments such as starting a CCB or increasing beta blocker (Bystolic to 10 mg). -Encourage to  continue with weight loss efforts and monitor sodium intake.  -Will collect CMP for medication monitoring.   Morbid obesity: -Associated with hypertension. -Patient has lost 3  pounds since last visit. Encourage to continue with dietary and lifestyle changes. Patient is interested on weight loss medication and advised we can further discuss once blood pressure is under better control. Patient verbalized understanding.    No orders of the defined types were placed in this encounter.   Follow-up: Return in about 2 weeks (around 04/20/2021) for HTN, Wt.    Lorrene Reid, PA-C

## 2021-04-06 NOTE — Patient Instructions (Signed)

## 2021-04-07 LAB — COMPREHENSIVE METABOLIC PANEL
ALT: 62 IU/L — ABNORMAL HIGH (ref 0–44)
AST: 30 IU/L (ref 0–40)
Albumin/Globulin Ratio: 1.3 (ref 1.2–2.2)
Albumin: 4.2 g/dL (ref 4.0–5.0)
Alkaline Phosphatase: 80 IU/L (ref 44–121)
BUN/Creatinine Ratio: 17 (ref 9–20)
BUN: 17 mg/dL (ref 6–20)
Bilirubin Total: 0.3 mg/dL (ref 0.0–1.2)
CO2: 25 mmol/L (ref 20–29)
Calcium: 9.7 mg/dL (ref 8.7–10.2)
Chloride: 101 mmol/L (ref 96–106)
Creatinine, Ser: 1.01 mg/dL (ref 0.76–1.27)
Globulin, Total: 3.2 g/dL (ref 1.5–4.5)
Glucose: 108 mg/dL — ABNORMAL HIGH (ref 65–99)
Potassium: 4.4 mmol/L (ref 3.5–5.2)
Sodium: 143 mmol/L (ref 134–144)
Total Protein: 7.4 g/dL (ref 6.0–8.5)
eGFR: 99 mL/min/{1.73_m2} (ref >59)

## 2021-04-07 LAB — CBC WITH DIFFERENTIAL/PLATELET
Basophils Absolute: 0.1 10*3/uL (ref 0.0–0.2)
Basos: 1 %
EOS (ABSOLUTE): 0.2 10*3/uL (ref 0.0–0.4)
Eos: 2 %
Hematocrit: 46 % (ref 37.5–51.0)
Hemoglobin: 15.9 g/dL (ref 13.0–17.7)
Immature Grans (Abs): 0.1 10*3/uL (ref 0.0–0.1)
Immature Granulocytes: 1 %
Lymphocytes Absolute: 2.6 10*3/uL (ref 0.7–3.1)
Lymphs: 28 %
MCH: 31.2 pg (ref 26.6–33.0)
MCHC: 34.6 g/dL (ref 31.5–35.7)
MCV: 90 fL (ref 79–97)
Monocytes Absolute: 0.8 10*3/uL (ref 0.1–0.9)
Monocytes: 9 %
Neutrophils Absolute: 5.5 10*3/uL (ref 1.4–7.0)
Neutrophils: 59 %
Platelets: 232 10*3/uL (ref 150–450)
RBC: 5.1 x10E6/uL (ref 4.14–5.80)
RDW: 13.2 % (ref 11.6–15.4)
WBC: 9.2 10*3/uL (ref 3.4–10.8)

## 2021-04-07 LAB — TSH: TSH: 0.759 u[IU]/mL (ref 0.450–4.500)

## 2021-04-07 LAB — HEMOGLOBIN A1C
Est. average glucose Bld gHb Est-mCnc: 105 mg/dL
Hgb A1c MFr Bld: 5.3 % (ref 4.8–5.6)

## 2021-04-07 LAB — LDL CHOLESTEROL, DIRECT: LDL Direct: 95 mg/dL (ref 0–99)

## 2021-04-20 ENCOUNTER — Encounter: Payer: Self-pay | Admitting: Physician Assistant

## 2021-04-20 ENCOUNTER — Ambulatory Visit: Payer: BC Managed Care – PPO | Admitting: Physician Assistant

## 2021-04-20 ENCOUNTER — Other Ambulatory Visit: Payer: Self-pay

## 2021-04-20 VITALS — BP 125/80 | HR 86 | Temp 97.5°F | Ht 74.0 in | Wt >= 6400 oz

## 2021-04-20 DIAGNOSIS — I10 Essential (primary) hypertension: Secondary | ICD-10-CM | POA: Diagnosis not present

## 2021-04-20 MED ORDER — PHENTERMINE-TOPIRAMATE ER 3.75-23 MG PO CP24: 1.0000 | ORAL_CAPSULE | Freq: Every day | ORAL | 0 refills | Status: DC

## 2021-04-20 NOTE — Progress Notes (Signed)
Established Patient Office Visit  Subjective:  Patient ID: Jamie Newton, male    DOB: Apr 13, 1986  Age: 35 y.o. MRN: [id number]  CC:  Chief Complaint  Patient presents with  . Follow-up  . Weight Loss  . Hypertension    HPI Jamie Newton presents for follow up on hypertension and discuss weight management.  HTN: Pt denies chest pain, palpitations, dizziness or lower extremity swelling. Taking medication as directed without side effects. Patient does not check BP at home. Pt follows a low salt diet. Reports good hydration.  Weight: In the past patient has tried Phentermine and My Fitness Pal without long term success. Reports one of the major challenges he has is when he's super busy and eats one meal a day, which is usually dinner and its late in the day and goes to bed shortly after.    Past Medical History:  Diagnosis Date  . Allergy    SEASONAL  . Asthma   . Hypertension     Past Surgical History:  Procedure Laterality Date  . APPENDECTOMY      Family History  Problem Relation Age of Onset  . Healthy Brother   . Asthma Daughter   . Arthritis Maternal Grandmother   . Healthy Maternal Grandfather   . Early death Paternal Grandfather        unknown  . Healthy Brother   . Healthy Daughter     Social History   Socioeconomic History  . Marital status: Married    Spouse name: Jamie Newton  . Number of children: 2  . Years of education: Not on file  . Highest education level: Not on file  Occupational History  . Occupation: Dentist: Jamie Newton  Tobacco Use  . Smoking status: Never Smoker  . Smokeless tobacco: Never Used  Substance and Sexual Activity  . Alcohol use: Yes    Alcohol/week: 1.0 standard drink    Types: 1 Cans of beer per week  . Drug use: No  . Sexual activity: Yes  Other Topics Concern  . Not on file  Social History Narrative  . Not on file   Social Determinants of Health   Financial Resource Strain: Not on file   Food Insecurity: Not on file  Transportation Needs: Not on file  Physical Activity: Not on file  Stress: Not on file  Social Connections: Not on file  Intimate Partner Violence: Not on file    Outpatient Medications Prior to Visit  Medication Sig Dispense Refill  . acetaminophen (TYLENOL) 650 MG CR tablet Take 650 mg by mouth every 8 (eight) hours as needed for pain.    . Cholecalciferol (VITAMIN D3) 5000 units TABS 5,000 IU OTC vitamin D3 daily. 90 tablet 12  . diclofenac (VOLTAREN) 75 MG EC tablet Take 1 tablet (75 mg total) by mouth 2 (two) times daily. 60 tablet 0  . losartan-hydrochlorothiazide (HYZAAR) 100-12.5 MG tablet Take 1 tablet by mouth daily. 90 tablet 0  . Misc Natural Products (OSTEO BI-FLEX JOINT SHIELD) TABS Take 1 tablet by mouth daily.    . nebivolol (BYSTOLIC) 5 MG tablet Take 1 tablet (5 mg total) by mouth daily. 90 tablet 0   No facility-administered medications prior to visit.    Allergies  Allergen Reactions  . Amoxicillin Rash  . Penicillins Rash    ROS Review of Systems A fourteen system review of systems was performed and found to be positive as per HPI.  Objective:  Physical Exam General:  Pleasant and cooperative, morbidly obese, in no acute distress Neuro:  Alert and oriented,  extra-ocular muscles intact  HEENT:  Normocephalic, atraumatic, neck supple  Skin:  no gross rash, warm, pink. Cardiac:  RRR, S1 S2 Respiratory:  ECTA B/L w/o wheezing, Not using accessory muscles, speaking in full sentences- unlabored. Vascular:  Ext warm, no cyanosis apprec.; cap RF less 2 sec. Psych:  No HI/SI, judgement and insight good, Euthymic mood. Full Affect.  BP 125/80   Pulse 86   Temp (!) 97.5 F (36.4 C)   Ht $R'6\' 2"'kk$  (1.88 m)   Wt (!) 425 lb 6.4 oz (193 kg)   SpO2 95%   BMI 54.62 kg/m  Wt Readings from Last 3 Encounters:  04/20/21 (!) 425 lb 6.4 oz (193 kg)  04/06/21 (!) 422 lb 12.8 oz (191.8 kg)  03/09/21 (!) 425 lb 8 oz (193 kg)      Health Maintenance Due  Topic Date Due  . Hepatitis C Screening  Never done  . COVID-19 Vaccine (1) Never done  . HIV Screening  Never done    There are no preventive care reminders to display for this patient.  Lab Results  Component Value Date   TSH 0.759 04/06/2021   Lab Results  Component Value Date   WBC 9.2 04/06/2021   HGB 15.9 04/06/2021   HCT 46.0 04/06/2021   MCV 90 04/06/2021   PLT 232 04/06/2021   Lab Results  Component Value Date   NA 143 04/06/2021   K 4.4 04/06/2021   CO2 25 04/06/2021   GLUCOSE 108 (H) 04/06/2021   BUN 17 04/06/2021   CREATININE 1.01 04/06/2021   BILITOT 0.3 04/06/2021   ALKPHOS 80 04/06/2021   AST 30 04/06/2021   ALT 62 (H) 04/06/2021   PROT 7.4 04/06/2021   ALBUMIN 4.2 04/06/2021   CALCIUM 9.7 04/06/2021   EGFR 99 04/06/2021   Lab Results  Component Value Date   CHOL 157 10/11/2016   Lab Results  Component Value Date   HDL 40 10/11/2016   Lab Results  Component Value Date   LDLCALC 94 10/11/2016   Lab Results  Component Value Date   TRIG 116 10/11/2016   Lab Results  Component Value Date   CHOLHDL 3.9 10/11/2016   Lab Results  Component Value Date   HGBA1C 5.3 04/06/2021      Assessment & Plan:   Problem List Items Addressed This Visit      Cardiovascular and Mediastinum   HTN (hypertension), benign - Primary (Chronic)    Other Visit Diagnoses    Morbid obesity (Jamie Newton)       Relevant Medications   Phentermine-Topiramate 3.75-23 MG CP24     HTN: - BP has significantly improved and at goal. - Continue current medication regimen. - Will continue to monitor.  Morbid Obesity: -Associated with hypertension. -Discussed with patient management options and would like to start oral medication vs injectable. Will start phentermine-topiramate. BP and pulse are wnl's today. -Encourage to have 3 meals/day and monitor calorie intake.  -Follow up in 4 weeks.  Meds ordered this encounter  Medications  .  Phentermine-Topiramate 3.75-23 MG CP24    Sig: Take 1 capsule by mouth daily.    Dispense:  30 capsule    Refill:  0    Order Specific Question:   Supervising Provider    Answer:   Jamie Newton [[id number]]    Follow-up: Return in about 4 weeks (around 05/18/2021)  for Wt, HTN.    Jamie Reid, PA-C

## 2021-04-20 NOTE — Patient Instructions (Signed)
Calorie Counting for Weight Loss Calories are units of energy. Your body needs a certain number of calories from food to keep going throughout the day. When you eat or drink more calories than your body needs, your body stores the extra calories mostly as fat. When you eat or drink fewer calories than your body needs, your body burns fat to get the energy it needs. Calorie counting means keeping track of how many calories you eat and drink each day. Calorie counting can be helpful if you need to lose weight. If you eat fewer calories than your body needs, you should lose weight. Ask your health care provider what a healthy weight is for you. For calorie counting to work, you will need to eat the right number of calories each day to lose a healthy amount of weight per week. A dietitian can help you figure out how many calories you need in a day and will suggest ways to reach your calorie goal.  A healthy amount of weight to lose each week is usually 1-2 lb (0.5-0.9 kg). This usually means that your daily calorie intake should be reduced by 500-750 calories.  Eating 1,200-1,500 calories a day can help most women lose weight.  Eating 1,500-1,800 calories a day can help most men lose weight. What do I need to know about calorie counting? Work with your health care provider or dietitian to determine how many calories you should get each day. To meet your daily calorie goal, you will need to:  Find out how many calories are in each food that you would like to eat. Try to do this before you eat.  Decide how much of the food you plan to eat.  Keep a food log. Do this by writing down what you ate and how many calories it had. To successfully lose weight, it is important to balance calorie counting with a healthy lifestyle that includes regular activity. Where do I find calorie information? The number of calories in a food can be found on a Nutrition Facts label. If a food does not have a Nutrition Facts  label, try to look up the calories online or ask your dietitian for help. Remember that calories are listed per serving. If you choose to have more than one serving of a food, you will have to multiply the calories per serving by the number of servings you plan to eat. For example, the label on a package of bread might say that a serving size is 1 slice and that there are 90 calories in a serving. If you eat 1 slice, you will have eaten 90 calories. If you eat 2 slices, you will have eaten 180 calories.   How do I keep a food log? After each time that you eat, record the following in your food log as soon as possible:  What you ate. Be sure to include toppings, sauces, and other extras on the food.  How much you ate. This can be measured in cups, ounces, or number of items.  How many calories were in each food and drink.  The total number of calories in the food you ate. Keep your food log near you, such as in a pocket-sized notebook or on an app or website on your mobile phone. Some programs will calculate calories for you and show you how many calories you have left to meet your daily goal. What are some portion-control tips?  Know how many calories are in a serving. This will   help you know how many servings you can have of a certain food.  Use a measuring cup to measure serving sizes. You could also try weighing out portions on a kitchen scale. With time, you will be able to estimate serving sizes for some foods.  Take time to put servings of different foods on your favorite plates or in your favorite bowls and cups so you know what a serving looks like.  Try not to eat straight from a food's packaging, such as from a bag or box. Eating straight from the package makes it hard to see how much you are eating and can lead to overeating. Put the amount you would like to eat in a cup or on a plate to make sure you are eating the right portion.  Use smaller plates, glasses, and bowls for smaller  portions and to prevent overeating.  Try not to multitask. For example, avoid watching TV or using your computer while eating. If it is time to eat, sit down at a table and enjoy your food. This will help you recognize when you are full. It will also help you be more mindful of what and how much you are eating. What are tips for following this plan? Reading food labels  Check the calorie count compared with the serving size. The serving size may be smaller than what you are used to eating.  Check the source of the calories. Try to choose foods that are high in protein, fiber, and vitamins, and low in saturated fat, trans fat, and sodium. Shopping  Read nutrition labels while you shop. This will help you make healthy decisions about which foods to buy.  Pay attention to nutrition labels for low-fat or fat-free foods. These foods sometimes have the same number of calories or more calories than the full-fat versions. They also often have added sugar, starch, or salt to make up for flavor that was removed with the fat.  Make a grocery list of lower-calorie foods and stick to it. Cooking  Try to cook your favorite foods in a healthier way. For example, try baking instead of frying.  Use low-fat dairy products. Meal planning  Use more fruits and vegetables. One-half of your plate should be fruits and vegetables.  Include lean proteins, such as chicken, turkey, and fish. Lifestyle Each week, aim to do one of the following:  150 minutes of moderate exercise, such as walking.  75 minutes of vigorous exercise, such as running. General information  Know how many calories are in the foods you eat most often. This will help you calculate calorie counts faster.  Find a way of tracking calories that works for you. Get creative. Try different apps or programs if writing down calories does not work for you. What foods should I eat?  Eat nutritious foods. It is better to have a nutritious,  high-calorie food, such as an avocado, than a food with few nutrients, such as a bag of potato chips.  Use your calories on foods and drinks that will fill you up and will not leave you hungry soon after eating. ? Examples of foods that fill you up are nuts and nut butters, vegetables, lean proteins, and high-fiber foods such as whole grains. High-fiber foods are foods with more than 5 g of fiber per serving.  Pay attention to calories in drinks. Low-calorie drinks include water and unsweetened drinks. The items listed above may not be a complete list of foods and beverages you can eat.   Contact a dietitian for more information.   What foods should I limit? Limit foods or drinks that are not good sources of vitamins, minerals, or protein or that are high in unhealthy fats. These include:  Candy.  Other sweets.  Sodas, specialty coffee drinks, alcohol, and juice. The items listed above may not be a complete list of foods and beverages you should avoid. Contact a dietitian for more information. How do I count calories when eating out?  Pay attention to portions. Often, portions are much larger when eating out. Try these tips to keep portions smaller: ? Consider sharing a meal instead of getting your own. ? If you get your own meal, eat only half of it. Before you start eating, ask for a container and put half of your meal into it. ? When available, consider ordering smaller portions from the menu instead of full portions.  Pay attention to your food and drink choices. Knowing the way food is cooked and what is included with the meal can help you eat fewer calories. ? If calories are listed on the menu, choose the lower-calorie options. ? Choose dishes that include vegetables, fruits, whole grains, low-fat dairy products, and lean proteins. ? Choose items that are boiled, broiled, grilled, or steamed. Avoid items that are buttered, battered, fried, or served with cream sauce. Items labeled as  crispy are usually fried, unless stated otherwise. ? Choose water, low-fat milk, unsweetened iced tea, or other drinks without added sugar. If you want an alcoholic beverage, choose a lower-calorie option, such as a glass of wine or light beer. ? Ask for dressings, sauces, and syrups on the side. These are usually high in calories, so you should limit the amount you eat. ? If you want a salad, choose a garden salad and ask for grilled meats. Avoid extra toppings such as bacon, cheese, or fried items. Ask for the dressing on the side, or ask for olive oil and vinegar or lemon to use as dressing.  Estimate how many servings of a food you are given. Knowing serving sizes will help you be aware of how much food you are eating at restaurants. Where to find more information  Centers for Disease Control and Prevention: www.cdc.gov  [place] Department of Agriculture: myplate.gov Summary  Calorie counting means keeping track of how many calories you eat and drink each day. If you eat fewer calories than your body needs, you should lose weight.  A healthy amount of weight to lose per week is usually 1-2 lb (0.5-0.9 kg). This usually means reducing your daily calorie intake by 500-750 calories.  The number of calories in a food can be found on a Nutrition Facts label. If a food does not have a Nutrition Facts label, try to look up the calories online or ask your dietitian for help.  Use smaller plates, glasses, and bowls for smaller portions and to prevent overeating.  Use your calories on foods and drinks that will fill you up and not leave you hungry shortly after a meal. This information is not intended to replace advice given to you by your health care provider. Make sure you discuss any questions you have with your health care provider. Document Revised: 01/15/2020 Document Reviewed: 01/15/2020 Elsevier Patient Education  2021 Elsevier Inc.  

## 2021-05-18 ENCOUNTER — Other Ambulatory Visit: Payer: Self-pay

## 2021-05-18 ENCOUNTER — Telehealth: Payer: Self-pay | Admitting: Physician Assistant

## 2021-05-18 ENCOUNTER — Encounter: Payer: Self-pay | Admitting: Physician Assistant

## 2021-05-18 ENCOUNTER — Ambulatory Visit (INDEPENDENT_AMBULATORY_CARE_PROVIDER_SITE_OTHER): Payer: BC Managed Care – PPO | Admitting: Physician Assistant

## 2021-05-18 DIAGNOSIS — I10 Essential (primary) hypertension: Secondary | ICD-10-CM

## 2021-05-18 MED ORDER — PHENTERMINE-TOPIRAMATE ER 7.5-46 MG PO CP24: 1.0000 | ORAL_CAPSULE | Freq: Every day | ORAL | 0 refills | Status: DC

## 2021-05-18 NOTE — Telephone Encounter (Signed)
PA attempted for patient Qsymia 7.5-46mg  ER Capsules.   Left msg for patient to advise weight loss medication or treatments not covered under his medical plan. AS, CMA

## 2021-05-18 NOTE — Progress Notes (Signed)
Established Patient Office Visit  Subjective:  Patient ID: Jamie Newton, male    DOB: 01/12/1986  Age: 35 y.o. MRN: [id number]  CC:  Chief Complaint  Patient presents with  . Follow-up  . Hypertension    HPI JAIRE PINKHAM presents for follow up on hypertension and weight management.  HTN: Pt denies chest pain, palpitations, dizziness or edema. Taking medication as directed without side effects. Does not check BP. Continues with good hydration.  Weight: Reports medication compliance. Denies sleep disturbance, mood changes, chest pain or palpitations. Reports has set reminders on his phone to ensure he gets 3 meals/day to avoid having one large meal per day. States can notice medication starts to wear off towards the afternoon because he starts to get hungry. Continue with exercise regimen.   Past Medical History:  Diagnosis Date  . Allergy    SEASONAL  . Asthma   . Hypertension     Past Surgical History:  Procedure Laterality Date  . APPENDECTOMY      Family History  Problem Relation Age of Onset  . Healthy Brother   . Asthma Daughter   . Arthritis Maternal Grandmother   . Healthy Maternal Grandfather   . Early death Paternal Grandfather        unknown  . Healthy Brother   . Healthy Daughter     Social History   Socioeconomic History  . Marital status: Married    Spouse name: Insurance claims handler  . Number of children: 2  . Years of education: Not on file  . Highest education level: Not on file  Occupational History  . Occupation: Dentist: [id number]  Tobacco Use  . Smoking status: Never Smoker  . Smokeless tobacco: Never Used  Substance and Sexual Activity  . Alcohol use: Yes    Alcohol/week: 1.0 standard drink    Types: 1 Cans of beer per week  . Drug use: No  . Sexual activity: Yes  Other Topics Concern  . Not on file  Social History Narrative  . Not on file   Social Determinants of Health   Financial Resource Strain: Not on file   Food Insecurity: Not on file  Transportation Needs: Not on file  Physical Activity: Not on file  Stress: Not on file  Social Connections: Not on file  Intimate Partner Violence: Not on file    Outpatient Medications Prior to Visit  Medication Sig Dispense Refill  . acetaminophen (TYLENOL) 650 MG CR tablet Take 650 mg by mouth every 8 (eight) hours as needed for pain.    . Cholecalciferol (VITAMIN D3) 5000 units TABS 5,000 IU OTC vitamin D3 daily. 90 tablet 12  . diclofenac (VOLTAREN) 75 MG EC tablet Take 1 tablet (75 mg total) by mouth 2 (two) times daily. 60 tablet 0  . losartan-hydrochlorothiazide (HYZAAR) 100-12.5 MG tablet Take 1 tablet by mouth daily. 90 tablet 0  . Misc Natural Products (OSTEO BI-FLEX JOINT SHIELD) TABS Take 1 tablet by mouth daily.    . nebivolol (BYSTOLIC) 5 MG tablet Take 1 tablet (5 mg total) by mouth daily. 90 tablet 0  . phentermine 37.5 MG capsule Take 37.5 mg by mouth daily.    . Phentermine-Topiramate 3.75-23 MG CP24 Take 1 capsule by mouth daily. 30 capsule 0   No facility-administered medications prior to visit.    Allergies  Allergen Reactions  . Amoxicillin Rash  . Penicillins Rash    ROS Review of Systems A fourteen system  review of systems was performed and found to be positive as per HPI.    Objective:    Physical Exam General:  Well Developed, well nourished, appropriate for stated age.  Neuro:  Alert and oriented,  extra-ocular muscles intact  HEENT:  Normocephalic, atraumatic, neck supple Skin:  no gross rash, warm, pink. Cardiac:  RRR, S1 S2 Respiratory:  ECTA B/L, Not using accessory muscles, speaking in full sentences- unlabored. Vascular:  Ext warm, no cyanosis apprec.; cap RF less 2 sec. Psych:  No HI/SI, judgement and insight good, Euthymic mood. Full Affect.   BP 123/82   Pulse 82   Temp (!) 97.1 F (36.2 C)   Ht _0  (1.905 m)   Wt (!) 412 lb 6.4 oz (187.1 kg)   SpO2 96%   BMI 51.55 kg/m  Wt Readings from Last  3 Encounters:  05/18/21 (!) 412 lb 6.4 oz (187.1 kg)  04/20/21 (!) 425 lb 6.4 oz (193 kg)  04/06/21 (!) 422 lb 12.8 oz (191.8 kg)     Health Maintenance Due  Topic Date Due  . COVID-19 Vaccine (1) Never done  . Pneumococcal Vaccine 8-71 Years old (1 of 2 - PPSV23) Never done  . HIV Screening  Never done  . Hepatitis C Screening  Never done    There are no preventive care reminders to display for this patient.  Lab Results  Component Value Date   TSH 0.759 04/06/2021   Lab Results  Component Value Date   WBC 9.2 04/06/2021   HGB 15.9 04/06/2021   HCT 46.0 04/06/2021   MCV 90 04/06/2021   PLT 232 04/06/2021   Lab Results  Component Value Date   NA 143 04/06/2021   K 4.4 04/06/2021   CO2 25 04/06/2021   GLUCOSE 108 (H) 04/06/2021   BUN 17 04/06/2021   CREATININE 1.01 04/06/2021   BILITOT 0.3 04/06/2021   ALKPHOS 80 04/06/2021   AST 30 04/06/2021   ALT 62 (H) 04/06/2021   PROT 7.4 04/06/2021   ALBUMIN 4.2 04/06/2021   CALCIUM 9.7 04/06/2021   EGFR 99 04/06/2021   Lab Results  Component Value Date   CHOL 157 10/11/2016   Lab Results  Component Value Date   HDL 40 10/11/2016   Lab Results  Component Value Date   LDLCALC 94 10/11/2016   Lab Results  Component Value Date   TRIG 116 10/11/2016   Lab Results  Component Value Date   CHOLHDL 3.9 10/11/2016   Lab Results  Component Value Date   HGBA1C 5.3 04/06/2021      Assessment & Plan:   Problem List Items Addressed This Visit      Cardiovascular and Mediastinum   HTN (hypertension), benign (Chronic)    Other Visit Diagnoses    Morbid obesity ([place])    -  Primary   Relevant Medications   Phentermine-Topiramate 7.5-46 MG CP24     Morbid obesity: -Associated with hypertension. -Pt has lost 13 pounds since starting medication.  -BP and pulse stable. Will titrate phentermine-topiramate to 7.5-46 mg. -Continue with incorporating 3 small meals with intermittent snacks, portion control and  physical activity. -Follow up in 4 weeks.  Hypertension: -Controlled. -Continue current medication regimen. -Recommend to continue with weight loss efforts. -Will continue to monitor.   Meds ordered this encounter  Medications  . Phentermine-Topiramate 7.5-46 MG CP24    Sig: Take 1 capsule by mouth daily.    Dispense:  30 capsule    Refill:  0  Order Specific Question:   Supervising Provider    Answer:   Beatrice Lecher D [[id number]]    Follow-up: Return in about 4 weeks (around 06/15/2021) for Wt.   Note:  This note was prepared with assistance of Dragon voice recognition software. Occasional wrong-word or sound-a-like substitutions may have occurred due to the inherent limitations of voice recognition software.   Lorrene Reid, PA-C

## 2021-05-23 ENCOUNTER — Other Ambulatory Visit: Payer: Self-pay | Admitting: Physician Assistant

## 2021-05-23 DIAGNOSIS — M2550 Pain in unspecified joint: Secondary | ICD-10-CM

## 2021-05-23 DIAGNOSIS — I10 Essential (primary) hypertension: Secondary | ICD-10-CM

## 2021-05-23 MED ORDER — NEBIVOLOL HCL 5 MG PO TABS: 5.0000 mg | ORAL_TABLET | Freq: Every day | ORAL | 0 refills | Status: DC

## 2021-05-23 MED ORDER — LOSARTAN POTASSIUM-HCTZ 100-12.5 MG PO TABS: 1.0000 | ORAL_TABLET | Freq: Every day | ORAL | 0 refills | Status: DC

## 2021-05-23 MED ORDER — DICLOFENAC SODIUM 75 MG PO TBEC: 75.0000 mg | DELAYED_RELEASE_TABLET | Freq: Two times a day (BID) | ORAL | 0 refills | Status: DC

## 2021-05-23 NOTE — Telephone Encounter (Signed)
Patients phentermine-topiramate was not approved by insurance. Pt states he was once on a lower dose of phentermine that was covered and is requesting to do that again. AS, CMA

## 2021-05-24 MED ORDER — TOPIRAMATE 50 MG PO TABS: 50.0000 mg | ORAL_TABLET | Freq: Every day | ORAL | 0 refills | Status: DC

## 2021-05-24 MED ORDER — PHENTERMINE HCL 8 MG PO TABS: 1.0000 | ORAL_TABLET | Freq: Every day | ORAL | 0 refills | Status: DC

## 2021-05-24 NOTE — Telephone Encounter (Signed)
I recommend sending individual prescriptions of phentermine-topiramate. Phentermine alone is used short term for 12 weeks and would like for patient to be on a long term weight loss medication (that can be taken >12 weeks) to ensure weight loss efforts are more successful. If not covered by insurance as individual prescriptions then will consider sending phentermine.  Thank you, Kandis Cocking

## 2021-05-24 NOTE — Addendum Note (Signed)
Addended by: Sylvester Harder on: 05/24/2021 10:38 AM   Modules accepted: Orders

## 2021-05-25 ENCOUNTER — Other Ambulatory Visit: Payer: Self-pay | Admitting: Physician Assistant

## 2021-05-26 NOTE — Telephone Encounter (Signed)
Spoke with patient about medications and made him aware to talk to pharmacy staff about medication cost with insurance and with medication coupons. He will callback with any questions.

## 2021-06-16 ENCOUNTER — Other Ambulatory Visit: Payer: Self-pay

## 2021-06-16 ENCOUNTER — Ambulatory Visit (INDEPENDENT_AMBULATORY_CARE_PROVIDER_SITE_OTHER): Payer: BC Managed Care – PPO | Admitting: Physician Assistant

## 2021-06-16 ENCOUNTER — Encounter: Payer: Self-pay | Admitting: Physician Assistant

## 2021-06-16 MED ORDER — TOPIRAMATE 50 MG PO TABS: 50.0000 mg | ORAL_TABLET | Freq: Every day | ORAL | 0 refills | Status: DC

## 2021-06-16 MED ORDER — PHENTERMINE HCL 8 MG PO TABS: 1.0000 | ORAL_TABLET | Freq: Every day | ORAL | 0 refills | Status: DC

## 2021-06-16 NOTE — Progress Notes (Signed)
Established Patient Office Visit  Subjective:  Patient ID: Jamie Newton, male    DOB: [birth year]  Age: 35 y.o. MRN: [id number]  CC:  Chief Complaint  Patient presents with   Weight Check    HPI [name] presents for follow-up on weight management.  States has been extremely busy with work the past few weeks going between [place] and [place] managing various [employer].  Has made an effort to not skip any meals and has noticed is eating less since he is traveling a lot more.  Incorporating protein, grilled chicken and water with his diet.  Reports insurance would not cover Qsymia and was without medication for about 2-3 weeks. Patient reports pharmacy did not receive individual prescriptions sent and decided to use a good Rx coupon.  Has been on medication for about 10 to 12 days.  Past Medical History:  Diagnosis Date   Allergy    SEASONAL   Asthma    Hypertension     Past Surgical History:  Procedure Laterality Date   APPENDECTOMY      Family History  Problem Relation Age of Onset   Healthy Brother    Asthma Daughter    Arthritis Maternal Grandmother    Healthy Maternal Grandfather    Early death Paternal Grandfather        unknown   Healthy Brother    Healthy Daughter     Social History   Socioeconomic History   Marital status: Married    Spouse name: Tax inspector   Number of children: 2   Years of education: Not on file   Highest education level: Not on file  Occupational History   Occupation: Company secretary: [employer]  Tobacco Use   Smoking status: Never   Smokeless tobacco: Never  Substance and Sexual Activity   Alcohol use: Yes    Alcohol/week: 1.0 standard drink    Types: 1 Cans of beer per week   Drug use: No   Sexual activity: Yes  Other Topics Concern   Not on file  Social History Narrative   Not on file   Social Determinants of Health   Financial Resource Strain: Not on file  Food Insecurity: Not on file   Transportation Needs: Not on file  Physical Activity: Not on file  Stress: Not on file  Social Connections: Not on file  Intimate Partner Violence: Not on file    Outpatient Medications Prior to Visit  Medication Sig Dispense Refill   acetaminophen (TYLENOL) 650 MG CR tablet Take 650 mg by mouth every 8 (eight) hours as needed for pain.     Cholecalciferol (VITAMIN D3) 5000 units TABS 5,000 IU OTC vitamin D3 daily. 90 tablet 12   diclofenac (VOLTAREN) 75 MG EC tablet Take 1 tablet (75 mg total) by mouth 2 (two) times daily. 60 tablet 0   losartan-hydrochlorothiazide (HYZAAR) 100-12.5 MG tablet Take 1 tablet by mouth daily. 90 tablet 0   Misc Natural Products (OSTEO BI-FLEX JOINT SHIELD) TABS Take 1 tablet by mouth daily.     nebivolol (BYSTOLIC) 5 MG tablet Take 1 tablet (5 mg total) by mouth daily. 90 tablet 0   Phentermine HCl 8 MG TABS Take 1 tablet by mouth daily. 28 tablet 0   topiramate (TOPAMAX) 50 MG tablet Take 1 tablet (50 mg total) by mouth daily. 30 tablet 0   No facility-administered medications prior to visit.    Allergies  Allergen Reactions   Amoxicillin Rash  Penicillins Rash    ROS Review of Systems A fourteen system review of systems was performed and found to be positive as per HPI.   Objective:    Physical Exam General:  Well Developed, well nourished, appropriate for stated age.  Neuro:  Alert and oriented,  extra-ocular muscles intact  HEENT:  Normocephalic, atraumatic, neck supple Skin:  no gross rash, warm, pink. Cardiac:  RRR, distant heart sounds due to body habitus Respiratory:  ECTA B/L, Not using accessory muscles, speaking in full sentences- unlabored. Vascular:  Ext warm, no cyanosis apprec.; cap RF less 2 sec. Psych:  No HI/SI, judgement and insight good, Euthymic mood. Full Affect.  BP (!) 148/83   Pulse 79   Temp 97.9 F (36.6 C)   Ht 6\' 2"  (1.88 m)   Wt (!) 408 lb 14.4 oz (185.5 kg)   SpO2 98%   BMI 52.50 kg/m  Wt Readings  from Last 3 Encounters:  06/16/21 (!) 408 lb 14.4 oz (185.5 kg)  05/18/21 (!) 412 lb 6.4 oz (187.1 kg)  04/20/21 (!) 425 lb 6.4 oz (193 kg)     Health Maintenance Due  Topic Date Due   COVID-19 Vaccine (1) Never done   HIV Screening  Never done   Hepatitis C Screening  Never done    There are no preventive care reminders to display for this patient.  Lab Results  Component Value Date   TSH 0.759 04/06/2021   Lab Results  Component Value Date   WBC 9.2 04/06/2021   HGB 15.9 04/06/2021   HCT 46.0 04/06/2021   MCV 90 04/06/2021   PLT 232 04/06/2021   Lab Results  Component Value Date   NA 143 04/06/2021   K 4.4 04/06/2021   CO2 25 04/06/2021   GLUCOSE 108 (H) 04/06/2021   BUN 17 04/06/2021   CREATININE 1.01 04/06/2021   BILITOT 0.3 04/06/2021   ALKPHOS 80 04/06/2021   AST 30 04/06/2021   ALT 62 (H) 04/06/2021   PROT 7.4 04/06/2021   ALBUMIN 4.2 04/06/2021   CALCIUM 9.7 04/06/2021   EGFR 99 04/06/2021   Lab Results  Component Value Date   CHOL 157 10/11/2016   Lab Results  Component Value Date   HDL 40 10/11/2016   Lab Results  Component Value Date   LDLCALC 94 10/11/2016   Lab Results  Component Value Date   TRIG 116 10/11/2016   Lab Results  Component Value Date   CHOLHDL 3.9 10/11/2016   Lab Results  Component Value Date   HGBA1C 5.3 04/06/2021      Assessment & Plan:   Problem List Items Addressed This Visit   None Visit Diagnoses     Morbid obesity ([facility])    -  Primary   Relevant Medications   Phentermine HCl 8 MG TABS   topiramate (TOPAMAX) 50 MG tablet      Morbid obesity: -Patient has lost approximately 4 pounds since last office visit and likely related to being without medication for 3 weeks.  We will resend prescriptions for phentermine 8 mg and topiramate 50 mg. -Recommend to continue dietary changes and exercise regimen. -BP mildly elevated today. HR wnl's. Previously stable <130/85. Will repeat at follow-up  visit. -Follow-up 4 weeks for weight management.  Meds ordered this encounter  Medications   Phentermine HCl 8 MG TABS    Sig: Take 1 tablet by mouth daily.    Dispense:  28 tablet    Refill:  0  Order Specific Question:   Supervising Provider    Answer:   Beatrice Lecher D [[id number]]   topiramate (TOPAMAX) 50 MG tablet    Sig: Take 1 tablet (50 mg total) by mouth daily.    Dispense:  30 tablet    Refill:  0    Order Specific Question:   Supervising Provider    Answer:   Beatrice Lecher D [[id number]]    Follow-up: Return in about 4 weeks (around 07/14/2021) for Wt.   Note:  This note was prepared with assistance of Dragon voice recognition software. Occasional wrong-word or sound-a-like substitutions may have occurred due to the inherent limitations of voice recognition software.   Lorrene Reid, PA-C

## 2021-08-03 ENCOUNTER — Ambulatory Visit: Payer: BC Managed Care – PPO | Admitting: Physician Assistant

## 2021-09-03 ENCOUNTER — Other Ambulatory Visit: Payer: Self-pay | Admitting: Physician Assistant

## 2021-09-03 DIAGNOSIS — M2550 Pain in unspecified joint: Secondary | ICD-10-CM

## 2021-09-05 ENCOUNTER — Other Ambulatory Visit: Payer: Self-pay

## 2021-09-05 DIAGNOSIS — M2550 Pain in unspecified joint: Secondary | ICD-10-CM

## 2021-09-05 MED ORDER — DICLOFENAC SODIUM 75 MG PO TBEC: 75.0000 mg | DELAYED_RELEASE_TABLET | Freq: Two times a day (BID) | ORAL | 0 refills | Status: DC

## 2021-09-12 ENCOUNTER — Encounter: Payer: Self-pay | Admitting: Physician Assistant

## 2021-09-12 ENCOUNTER — Ambulatory Visit: Payer: BC Managed Care – PPO | Admitting: Physician Assistant

## 2021-09-12 ENCOUNTER — Other Ambulatory Visit: Payer: Self-pay

## 2021-09-12 DIAGNOSIS — I10 Essential (primary) hypertension: Secondary | ICD-10-CM | POA: Diagnosis not present

## 2021-09-12 MED ORDER — NEBIVOLOL HCL 5 MG PO TABS: 5.0000 mg | ORAL_TABLET | Freq: Every day | ORAL | 0 refills | Status: DC

## 2021-09-12 MED ORDER — PHENTERMINE HCL 8 MG PO TABS: 1.0000 | ORAL_TABLET | Freq: Every day | ORAL | 0 refills | Status: DC

## 2021-09-12 MED ORDER — TOPIRAMATE 50 MG PO TABS
50.0000 mg | ORAL_TABLET | Freq: Every day | ORAL | 0 refills | Status: DC
Start: 2021-09-12 — End: 2021-12-14

## 2021-09-12 MED ORDER — LOSARTAN POTASSIUM-HCTZ 100-12.5 MG PO TABS: 1.0000 | ORAL_TABLET | Freq: Every day | ORAL | 0 refills | Status: DC

## 2021-09-12 NOTE — Progress Notes (Signed)
Established Patient Office Visit  Subjective:  Patient ID: Jamie Newton, male    DOB: 10/12/1986  Age: 35 y.o. MRN: [id number]  CC:  Chief Complaint  Patient presents with   Follow-up   Hypertension    HPI Jamie Newton presents for follow up on hypertension. Patient takes diclofenac as needed for joint pain, once or twice a week.  HTN: Pt denies chest pain, palpitations, dizziness,shortness of breath or lower extremity swelling. Taking medication as directed without side effects. Pt follows a low salt diet.  Weight: States has been busy with work. 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 Forgot about his last appointment and missed the visit so has been out of his medications. Has tried to make the best options with eating habits such as grilled chicken and vegetables. Plans to start working out at [employer].   Past Medical History:  Diagnosis Date   Allergy    SEASONAL   Asthma    Hypertension     Past Surgical History:  Procedure Laterality Date   APPENDECTOMY      Family History  Problem Relation Age of Onset   Healthy Brother    Asthma Daughter    Arthritis Maternal Grandmother    Healthy Maternal Grandfather    Early death Paternal Grandfather        unknown   Healthy Brother    Healthy Daughter     Social History   Socioeconomic History   Marital status: Married    Spouse name: Insurance claims handler   Number of children: 2   Years of education: Not on file   Highest education level: Not on file  Occupational History   Occupation: Dentist: [id number]  Tobacco Use   Smoking status: Never   Smokeless tobacco: Never  Substance and Sexual Activity   Alcohol use: Yes    Alcohol/week: 1.0 standard drink    Types: 1 Cans of beer per week   Drug use: No   Sexual activity: Yes  Other Topics Concern   Not on file  Social History Narrative   Not on file   Social Determinants of Health   Financial Resource Strain:  Not on file  Food Insecurity: Not on file  Transportation Needs: Not on file  Physical Activity: Not on file  Stress: Not on file  Social Connections: Not on file  Intimate Partner Violence: Not on file    Outpatient Medications Prior to Visit  Medication Sig Dispense Refill   acetaminophen (TYLENOL) 650 MG CR tablet Take 650 mg by mouth every 8 (eight) hours as needed for pain.     Cholecalciferol (VITAMIN D3) 5000 units TABS 5,000 IU OTC vitamin D3 daily. 90 tablet 12   diclofenac (VOLTAREN) 75 MG EC tablet Take 1 tablet (75 mg total) by mouth 2 (two) times daily. 60 tablet 0   Misc Natural Products (OSTEO BI-FLEX JOINT SHIELD) TABS Take 1 tablet by mouth daily.     losartan-hydrochlorothiazide (HYZAAR) 100-12.5 MG tablet Take 1 tablet by mouth daily. 90 tablet 0   nebivolol (BYSTOLIC) 5 MG tablet Take 1 tablet (5 mg total) by mouth daily. 90 tablet 0   Phentermine HCl 8 MG TABS Take 1 tablet by mouth daily. 28 tablet 0   topiramate (TOPAMAX) 50 MG tablet Take 1 tablet (50 mg total) by mouth daily. 30 tablet 0   No facility-administered medications prior to visit.    Allergies  Allergen Reactions   Amoxicillin Rash   Penicillins Rash    ROS Review of Systems A fourteen system review of systems was performed and found to be positive as per HPI.   Objective:    Physical Exam General:  Well Developed, well nourished, appropriate for stated age.  Neuro:  Alert and oriented,  extra-ocular muscles intact  HEENT:  Normocephalic, atraumatic, neck supple Skin:  no gross rash, warm, pink. Cardiac:  RRR, S1 S2 Respiratory: CTA B/L, dec breath sounds due to body habitus, Not using accessory muscles, speaking in full sentences- unlabored. Vascular:  Ext warm, no cyanosis apprec.; cap RF less 2 sec. Psych:  No HI/SI, judgement and insight good, Euthymic mood. Full Affect.  BP 116/74   Pulse 87   Temp 98.2 F (36.8 C)   Ht $R'6\' 2"'fe$  (1.88 m)   Wt (!) 409 lb (185.5 kg)   SpO2 96%    BMI 52.51 kg/m  Wt Readings from Last 3 Encounters:  09/12/21 (!) 409 lb (185.5 kg)  06/16/21 (!) 408 lb 14.4 oz (185.5 kg)  05/18/21 (!) 412 lb 6.4 oz (187.1 kg)     Health Maintenance Due  Topic Date Due   COVID-19 Vaccine (1) Never done   HIV Screening  Never done   Hepatitis C Screening  Never done   INFLUENZA VACCINE  Never done    There are no preventive care reminders to display for this patient.  Lab Results  Component Value Date   TSH 0.759 04/06/2021   Lab Results  Component Value Date   WBC 9.2 04/06/2021   HGB 15.9 04/06/2021   HCT 46.0 04/06/2021   MCV 90 04/06/2021   PLT 232 04/06/2021   Lab Results  Component Value Date   NA 143 04/06/2021   K 4.4 04/06/2021   CO2 25 04/06/2021   GLUCOSE 108 (H) 04/06/2021   BUN 17 04/06/2021   CREATININE 1.01 04/06/2021   BILITOT 0.3 04/06/2021   ALKPHOS 80 04/06/2021   AST 30 04/06/2021   ALT 62 (H) 04/06/2021   PROT 7.4 04/06/2021   ALBUMIN 4.2 04/06/2021   CALCIUM 9.7 04/06/2021   EGFR 99 04/06/2021   Lab Results  Component Value Date   CHOL 157 10/11/2016   Lab Results  Component Value Date   HDL 40 10/11/2016   Lab Results  Component Value Date   LDLCALC 94 10/11/2016   Lab Results  Component Value Date   TRIG 116 10/11/2016   Lab Results  Component Value Date   CHOLHDL 3.9 10/11/2016   Lab Results  Component Value Date   HGBA1C 5.3 04/06/2021      Assessment & Plan:   Problem List Items Addressed This Visit       Cardiovascular and Mediastinum   HTN (hypertension), benign (Chronic)    -Controlled. -Continue current medication regimen. Provided refills. Last CMP, renal function and electrolytes normal. -Will continue to monitor.      Relevant Medications   nebivolol (BYSTOLIC) 5 MG tablet   losartan-hydrochlorothiazide (HYZAAR) 100-12.5 MG tablet   Other Visit Diagnoses     Morbid obesity ([place])       Relevant Medications   Phentermine HCl 8 MG TABS   topiramate  (TOPAMAX) 50 MG tablet      Morbid obesity: -Associated with hypertension. -Encourage to continue weight loss efforts with dietary and lifestyle changes in conjunction with medication therapy. Recommend to increase exercise with ultimate goal of 150 mins/wk of moderate physical activity.  -  Follow up in 4 weeks.   Meds ordered this encounter  Medications   Phentermine HCl 8 MG TABS    Sig: Take 1 tablet by mouth daily.    Dispense:  30 tablet    Refill:  0    Order Specific Question:   Supervising Provider    Answer:   Beatrice Lecher D [[id number]]   topiramate (TOPAMAX) 50 MG tablet    Sig: Take 1 tablet (50 mg total) by mouth daily.    Dispense:  30 tablet    Refill:  0    Order Specific Question:   Supervising Provider    Answer:   Beatrice Lecher D [[id number]]   nebivolol (BYSTOLIC) 5 MG tablet    Sig: Take 1 tablet (5 mg total) by mouth daily.    Dispense:  90 tablet    Refill:  0    Order Specific Question:   Supervising Provider    Answer:   Hali Marry [[id number]]   losartan-hydrochlorothiazide (HYZAAR) 100-12.5 MG tablet    Sig: Take 1 tablet by mouth daily.    Dispense:  90 tablet    Refill:  0    Order Specific Question:   Supervising Provider    Answer:   Beatrice Lecher D [[id number]]    Follow-up: Return in about 4 weeks (around 10/10/2021) for Wt.    Lorrene Reid, PA-C

## 2021-09-12 NOTE — Assessment & Plan Note (Signed)
-  Controlled. -Continue current medication regimen. Provided refills. Last CMP, renal function and electrolytes normal. -Will continue to monitor.

## 2021-10-28 DIAGNOSIS — H0259 Other disorders affecting eyelid function: Secondary | ICD-10-CM | POA: Diagnosis not present

## 2021-10-31 ENCOUNTER — Other Ambulatory Visit: Payer: Self-pay | Admitting: Physician Assistant

## 2021-10-31 DIAGNOSIS — M2550 Pain in unspecified joint: Secondary | ICD-10-CM

## 2021-10-31 MED ORDER — DICLOFENAC SODIUM 75 MG PO TBEC
75.0000 mg | DELAYED_RELEASE_TABLET | Freq: Two times a day (BID) | ORAL | 0 refills | Status: DC
Start: 2021-10-31 — End: 2021-12-14

## 2021-12-08 DIAGNOSIS — H0259 Other disorders affecting eyelid function: Secondary | ICD-10-CM | POA: Diagnosis not present

## 2021-12-14 ENCOUNTER — Ambulatory Visit (INDEPENDENT_AMBULATORY_CARE_PROVIDER_SITE_OTHER): Payer: BC Managed Care – PPO | Admitting: Physician Assistant

## 2021-12-14 ENCOUNTER — Other Ambulatory Visit: Payer: Self-pay

## 2021-12-14 ENCOUNTER — Encounter: Payer: Self-pay | Admitting: Physician Assistant

## 2021-12-14 DIAGNOSIS — I10 Essential (primary) hypertension: Secondary | ICD-10-CM

## 2021-12-14 DIAGNOSIS — M2550 Pain in unspecified joint: Secondary | ICD-10-CM | POA: Diagnosis not present

## 2021-12-14 MED ORDER — DICLOFENAC SODIUM 75 MG PO TBEC: 75.0000 mg | DELAYED_RELEASE_TABLET | Freq: Two times a day (BID) | ORAL | 0 refills | Status: DC

## 2021-12-14 MED ORDER — LOSARTAN POTASSIUM-HCTZ 100-12.5 MG PO TABS: 1.0000 | ORAL_TABLET | Freq: Every day | ORAL | 0 refills | Status: DC

## 2021-12-14 MED ORDER — NEBIVOLOL HCL 5 MG PO TABS: 5.0000 mg | ORAL_TABLET | Freq: Every day | ORAL | 0 refills | Status: DC

## 2021-12-14 MED ORDER — PHENTERMINE HCL 8 MG PO TABS: 1.0000 | ORAL_TABLET | Freq: Every day | ORAL | 0 refills | Status: DC

## 2021-12-14 MED ORDER — TOPIRAMATE 50 MG PO TABS: 50.0000 mg | ORAL_TABLET | Freq: Every day | ORAL | 0 refills | Status: DC

## 2021-12-14 NOTE — Patient Instructions (Signed)
Managing Your Hypertension Hypertension, also called high blood pressure, is when the force of the blood pressing against the walls of the arteries is too strong. Arteries are blood vessels that carry blood from your heart throughout your body. Hypertension forces the heart to work harder to pump blood and may cause the arteries to become narrow or stiff. Understanding blood pressure readings Your personal target blood pressure may vary depending on your medical conditions, your age, and other factors. A blood pressure reading includes a higher number over a lower number. Ideally, your blood pressure should be below 120/80. You should know that: The first, or top, number is called the systolic pressure. It is a measure of the pressure in your arteries as your heart beats. The second, or bottom number, is called the diastolic pressure. It is a measure of the pressure in your arteries as the heart relaxes. Blood pressure is classified into four stages. Based on your blood pressure reading, your health care provider may use the following stages to determine what type of treatment you need, if any. Systolic pressure and diastolic pressure are measured in a unit called mmHg. Normal Systolic pressure: below 120. Diastolic pressure: below 80. Elevated Systolic pressure: 120-129. Diastolic pressure: below 80. Hypertension stage 1 Systolic pressure: 130-139. Diastolic pressure: 80-89. Hypertension stage 2 Systolic pressure: 140 or above. Diastolic pressure: 90 or above. How can this condition affect me? Managing your hypertension is an important responsibility. Over time, hypertension can damage the arteries and decrease blood flow to important parts of the body, including the brain, heart, and kidneys. Having untreated or uncontrolled hypertension can lead to: A heart attack. A stroke. A weakened blood vessel (aneurysm). Heart failure. Kidney damage. Eye damage. Metabolic syndrome. Memory and  concentration problems. Vascular dementia. What actions can I take to manage this condition? Hypertension can be managed by making lifestyle changes and possibly by taking medicines. Your health care provider will help you make a plan to bring your blood pressure within a normal range. Nutrition  Eat a diet that is high in fiber and potassium, and low in salt (sodium), added sugar, and fat. An example eating plan is called the Dietary Approaches to Stop Hypertension (DASH) diet. To eat this way: Eat plenty of fresh fruits and vegetables. Try to fill one-half of your plate at each meal with fruits and vegetables. Eat whole grains, such as whole-wheat pasta, brown rice, or whole-grain bread. Fill about one-fourth of your plate with whole grains. Eat low-fat dairy products. Avoid fatty cuts of meat, processed or cured meats, and poultry with skin. Fill about one-fourth of your plate with lean proteins such as fish, chicken without skin, beans, eggs, and tofu. Avoid pre-made and processed foods. These tend to be higher in sodium, added sugar, and fat. Reduce your daily sodium intake. Most people with hypertension should eat less than 1,500 mg of sodium a day. Lifestyle  Work with your health care provider to maintain a healthy body weight or to lose weight. Ask what an ideal weight is for you. Get at least 30 minutes of exercise that causes your heart to beat faster (aerobic exercise) most days of the week. Activities may include walking, swimming, or biking. Include exercise to strengthen your muscles (resistance exercise), such as weight lifting, as part of your weekly exercise routine. Try to do these types of exercises for 30 minutes at least 3 days a week. Do not use any products that contain nicotine or tobacco, such as cigarettes, e-cigarettes,   and chewing tobacco. If you need help quitting, ask your health care provider. Control any long-term (chronic) conditions you have, such as high  cholesterol or diabetes. Identify your sources of stress and find ways to manage stress. This may include meditation, deep breathing, or making time for fun activities. Alcohol use Do not drink alcohol if: Your health care provider tells you not to drink. You are pregnant, may be pregnant, or are planning to become pregnant. If you drink alcohol: Limit how much you use to: 0-1 drink a day for women. 0-2 drinks a day for men. Be aware of how much alcohol is in your drink. In the [place] one drink equals one 12 oz bottle of beer (355 mL), one 5 oz glass of wine (148 mL), or one 1 oz glass of hard liquor (44 mL). Medicines Your health care provider may prescribe medicine if lifestyle changes are not enough to get your blood pressure under control and if: Your systolic blood pressure is 130 or higher. Your diastolic blood pressure is 80 or higher. Take medicines only as told by your health care provider. Follow the directions carefully. Blood pressure medicines must be taken as told by your health care provider. The medicine does not work as well when you skip doses. Skipping doses also puts you at risk for problems. Monitoring Before you monitor your blood pressure: Do not smoke, drink caffeinated beverages, or exercise within 30 minutes before taking a measurement. Use the bathroom and empty your bladder (urinate). Sit quietly for at least 5 minutes before taking measurements. Monitor your blood pressure at home as told by your health care provider. To do this: Sit with your back straight and supported. Place your feet flat on the floor. Do not cross your legs. Support your arm on a flat surface, such as a table. Make sure your upper arm is at heart level. Each time you measure, take two or three readings one minute apart and record the results. You may also need to have your blood pressure checked regularly by your health care provider. General information Talk with your health care  provider about your diet, exercise habits, and other lifestyle factors that may be contributing to hypertension. Review all the medicines you take with your health care provider because there may be side effects or interactions. Keep all visits as told by your health care provider. Your health care provider can help you create and adjust your plan for managing your high blood pressure. Where to find more information National Heart, Lung, and Blood Institute: www.nhlbi.nih.gov American Heart Association: www.heart.org Contact a health care provider if: You think you are having a reaction to medicines you have taken. You have repeated (recurrent) headaches. You feel dizzy. You have swelling in your ankles. You have trouble with your vision. Get help right away if: You develop a severe headache or confusion. You have unusual weakness or numbness, or you feel faint. You have severe pain in your chest or abdomen. You vomit repeatedly. You have trouble breathing. These symptoms may represent a serious problem that is an emergency. Do not wait to see if the symptoms will go away. Get medical help right away. Call your local emergency services (911 in the [place] Do not drive yourself to the hospital. Summary Hypertension is when the force of blood pumping through your arteries is too strong. If this condition is not controlled, it may put you at risk for serious complications. Your personal target blood pressure may vary depending on   your medical conditions, your age, and other factors. For most people, a normal blood pressure is less than 120/80. Hypertension is managed by lifestyle changes, medicines, or both. Lifestyle changes to help manage hypertension include losing weight, eating a healthy, low-sodium diet, exercising more, stopping smoking, and limiting alcohol. This information is not intended to replace advice given to you by your health care provider. Make sure you discuss any questions  you have with your health care provider. Document Revised: 12/22/2019 Document Reviewed: 11/04/2019 Elsevier Patient Education  2022 Elsevier Inc.  

## 2021-12-14 NOTE — Assessment & Plan Note (Signed)
-  BP elevated, BP repeated and mildly improved. Discussed with patient stress likely contributing to blood pressure elevations and if BP continues to be elevated in-office then recommend medication adjustments.  -Will continue to monitor.

## 2021-12-14 NOTE — Progress Notes (Signed)
Established Patient Office Visit  Subjective:  Patient ID: Jamie Newton, male    DOB: February 05, 1986  Age: 35 y.o. MRN: [id number]  CC:  Chief Complaint  Patient presents with   Follow-up   Medication Refill    HPI Jamie Newton presents for follow up on weight management and hypertension. Patient reports doing more weight lifting and states feeling better but has noticed some weight gain since starting weights. States has increased calorie intake to help compensate with weight training which he does 5 days per week and the other 2 days uses row machine. 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 Patient reports last dose of phentermine and topiramate were 2-3 days ago. Patient reports medication compliance with blood pressure medications. No chest pain, palpitations, dizziness, syncope or lower extremity swelling.    Past Medical History:  Diagnosis Date   Allergy    SEASONAL   Asthma    Hypertension     Past Surgical History:  Procedure Laterality Date   APPENDECTOMY      Family History  Problem Relation Age of Onset   Healthy Brother    Asthma Daughter    Arthritis Maternal Grandmother    Healthy Maternal Grandfather    Early death Paternal Grandfather        unknown   Healthy Brother    Healthy Daughter     Social History   Socioeconomic History   Marital status: Married    Spouse name: Insurance claims handler   Number of children: 2   Years of education: Not on file   Highest education level: Not on file  Occupational History   Occupation: Dentist: [id number]  Tobacco Use   Smoking status: Never   Smokeless tobacco: Never  Substance and Sexual Activity   Alcohol use: Yes    Alcohol/week: 1.0 standard drink    Types: 1 Cans of beer per week   Drug use: No   Sexual  activity: Yes  Other Topics Concern   Not on file  Social History Narrative   Not on file   Social Determinants of Health   Financial Resource Strain: Not on file  Food Insecurity: Not on file  Transportation Needs: Not on file  Physical Activity: Not on file  Stress: Not on file  Social Connections: Not on file  Intimate Partner Violence: Not on file    Outpatient Medications Prior to Visit  Medication Sig Dispense Refill   acetaminophen (TYLENOL) 650 MG CR tablet Take 650 mg by mouth every 8 (eight) hours as needed for pain.     Cholecalciferol (VITAMIN D3) 5000 units TABS 5,000 IU OTC vitamin D3 daily. 90 tablet 12   Misc Natural Products (OSTEO BI-FLEX JOINT SHIELD) TABS Take 1 tablet by mouth daily.     diclofenac (VOLTAREN) 75 MG EC tablet Take 1 tablet (75 mg total) by mouth 2 (two) times daily. 60 tablet 0   losartan-hydrochlorothiazide (HYZAAR) 100-12.5 MG tablet Take 1 tablet by mouth daily. 90 tablet 0   nebivolol (BYSTOLIC) 5 MG tablet Take 1 tablet (5 mg total) by mouth daily. 90 tablet 0   Phentermine HCl 8 MG TABS Take 1 tablet by mouth daily. 30 tablet 0   topiramate (TOPAMAX) 50 MG tablet Take 1 tablet (50 mg  total) by mouth daily. 30 tablet 0   No facility-administered medications prior to visit.    Allergies  Allergen Reactions   Amoxicillin Rash   Penicillins Rash    ROS Review of Systems Review of Systems:  A fourteen system review of systems was performed and found to be positive as per HPI.   Objective:    Physical Exam General:  Well Developed, well nourished, appropriate for stated age.  Neuro:  Alert and oriented,  extra-ocular muscles intact  HEENT:  Normocephalic, atraumatic, neck supple Skin:  no gross rash, warm, pink. Cardiac:  RRR, S1 S2 Respiratory: CTA B/L Vascular:  Ext warm, no cyanosis apprec.; cap RF less 2 sec. Psych:  No HI/SI, judgement and insight good, Euthymic mood. Full Affect.  BP 140/84    Pulse 88    Temp 97.8 F  (36.6 C)    Ht _0  (1.88 m)    Wt (!) 407 lb (184.6 kg)    SpO2 94%    BMI 52.26 kg/m  Wt Readings from Last 3 Encounters:  12/14/21 (!) 407 lb (184.6 kg)  09/12/21 (!) 409 lb (185.5 kg)  06/16/21 (!) 408 lb 14.4 oz (185.5 kg)     Health Maintenance Due  Topic Date Due   COVID-19 Vaccine (1) Never done   Pneumococcal Vaccine 61-59 Years old (1 - PCV) Never done   HIV Screening  Never done   Hepatitis C Screening  Never done   INFLUENZA VACCINE  Never done    There are no preventive care reminders to display for this patient.  Lab Results  Component Value Date   TSH 0.759 04/06/2021   Lab Results  Component Value Date   WBC 9.2 04/06/2021   HGB 15.9 04/06/2021   HCT 46.0 04/06/2021   MCV 90 04/06/2021   PLT 232 04/06/2021   Lab Results  Component Value Date   NA 143 04/06/2021   K 4.4 04/06/2021   CO2 25 04/06/2021   GLUCOSE 108 (H) 04/06/2021   BUN 17 04/06/2021   CREATININE 1.01 04/06/2021   BILITOT 0.3 04/06/2021   ALKPHOS 80 04/06/2021   AST 30 04/06/2021   ALT 62 (H) 04/06/2021   PROT 7.4 04/06/2021   ALBUMIN 4.2 04/06/2021   CALCIUM 9.7 04/06/2021   EGFR 99 04/06/2021   Lab Results  Component Value Date   CHOL 157 10/11/2016   Lab Results  Component Value Date   HDL 40 10/11/2016   Lab Results  Component Value Date   LDLCALC 94 10/11/2016   Lab Results  Component Value Date   TRIG 116 10/11/2016   Lab Results  Component Value Date   CHOLHDL 3.9 10/11/2016   Lab Results  Component Value Date   HGBA1C 5.3 04/06/2021      Assessment & Plan:   Problem List Items Addressed This Visit       Cardiovascular and Mediastinum   HTN (hypertension), benign (Chronic)    -BP elevated, BP repeated and mildly improved. Discussed with patient stress likely contributing to blood pressure elevations and if BP continues to be elevated in-office then recommend medication adjustments.  -Will continue to monitor.      Relevant Medications    losartan-hydrochlorothiazide (HYZAAR) 100-12.5 MG tablet   nebivolol (BYSTOLIC) 5 MG tablet     Other   Morbid obesity ([place]) - Primary    -Patient has lost 2 pounds since last OV. Recommend to continue with dietary and lifestyle changes. Discussed with patient potential  side effects with phentermine including HTN, last blood pressure reading was at goal. BP today mildly elevated likely secondary to acute stress reaction. Will continue to monitor.       Relevant Medications   Phentermine HCl 8 MG TABS   topiramate (TOPAMAX) 50 MG tablet   Other Visit Diagnoses     Polyarthralgia       Relevant Medications   diclofenac (VOLTAREN) 75 MG EC tablet       Meds ordered this encounter  Medications   diclofenac (VOLTAREN) 75 MG EC tablet    Sig: Take 1 tablet (75 mg total) by mouth 2 (two) times daily.    Dispense:  60 tablet    Refill:  0    Order Specific Question:   Supervising Provider    Answer:   Hali Marry [[id number]]   losartan-hydrochlorothiazide (HYZAAR) 100-12.5 MG tablet    Sig: Take 1 tablet by mouth daily.    Dispense:  90 tablet    Refill:  0    Order Specific Question:   Supervising Provider    Answer:   Beatrice Lecher D [[id number]]   nebivolol (BYSTOLIC) 5 MG tablet    Sig: Take 1 tablet (5 mg total) by mouth daily.    Dispense:  90 tablet    Refill:  0    Order Specific Question:   Supervising Provider    Answer:   Beatrice Lecher D [[id number]]   Phentermine HCl 8 MG TABS    Sig: Take 1 tablet by mouth daily.    Dispense:  30 tablet    Refill:  0    Order Specific Question:   Supervising Provider    Answer:   Beatrice Lecher D [[id number]]   topiramate (TOPAMAX) 50 MG tablet    Sig: Take 1 tablet (50 mg total) by mouth daily.    Dispense:  30 tablet    Refill:  0    Order Specific Question:   Supervising Provider    Answer:   Beatrice Lecher D [[id number]]    Follow-up: Return in about 8 weeks (around 02/08/2022) for Weight, HTN .    Lorrene Reid,  PA-C

## 2021-12-14 NOTE — Assessment & Plan Note (Signed)
-  Patient has lost 2 pounds since last OV. Recommend to continue with dietary and lifestyle changes. Discussed with patient potential side effects with phentermine including HTN, last blood pressure reading was at goal. BP today mildly elevated likely secondary to acute stress reaction. Will continue to monitor.

## 2022-01-25 NOTE — Progress Notes (Signed)
Established Patient Office Visit  Subjective:  Patient ID: Jamie Newton, male    DOB: 06-Oct-1986  Age: 36 y.o. MRN: [id number]  CC:  Chief Complaint  Patient presents with   Hypertension   Obesity    HPI Jamie Newton presents for follow up on weight management and hypertension.  Weight: Patient reports has noticed an improvement with not feeling  Continues with working out 5 days/per week which also includes using row machine and treadmill. Also reports walking the dogs. States has been meal prepping with a mixture of proteins and vegetables with the help of a friend. States has decreased two clothing sizes.   HTN: Pt denies chest pain, palpitations, dizziness, headache or lower extremity swelling. Taking medication as directed without side effects. Does not check BP at home. Sometimes has his blood pressure checked at work.   Polyarthralgia: State takes medication when needed for knee or ankle pain. Has needed to take medication more than usual due to being more active. But plans to start seeing a chiropractor as well to reduce medication use.   Past Medical History:  Diagnosis Date   Allergy    SEASONAL   Asthma    Hypertension     Past Surgical History:  Procedure Laterality Date   APPENDECTOMY      Family History  Problem Relation Age of Onset   Healthy Brother    Asthma Daughter    Arthritis Maternal Grandmother    Healthy Maternal Grandfather    Early death Paternal Grandfather        unknown   Healthy Brother    Healthy Daughter     Social History   Socioeconomic History   Marital status: Married    Spouse name: Tax inspector   Number of children: 2   Years of education: Not on file   Highest education level: Not on file  Occupational History   Occupation: Company secretary: [employer]  Tobacco Use   Smoking status: Never   Smokeless tobacco: Never  Substance and Sexual Activity   Alcohol use: Yes    Alcohol/week: 1.0 standard drink     Types: 1 Cans of beer per week   Drug use: No   Sexual activity: Yes  Other Topics Concern   Not on file  Social History Narrative   Not on file   Social Determinants of Health   Financial Resource Strain: Not on file  Food Insecurity: Not on file  Transportation Needs: Not on file  Physical Activity: Not on file  Stress: Not on file  Social Connections: Not on file  Intimate Partner Violence: Not on file    Outpatient Medications Prior to Visit  Medication Sig Dispense Refill   acetaminophen (TYLENOL) 650 MG CR tablet Take 650 mg by mouth every 8 (eight) hours as needed for pain.     Cholecalciferol (VITAMIN D3) 5000 units TABS 5,000 IU OTC vitamin D3 daily. 90 tablet 12   Misc Natural Products (OSTEO BI-FLEX JOINT SHIELD) TABS Take 1 tablet by mouth daily.     nebivolol (BYSTOLIC) 5 MG tablet Take 1 tablet (5 mg total) by mouth daily. 90 tablet 0   diclofenac (VOLTAREN) 75 MG EC tablet Take 1 tablet (75 mg total) by mouth 2 (two) times daily. 60 tablet 0   losartan-hydrochlorothiazide (HYZAAR) 100-12.5 MG tablet Take 1 tablet by mouth daily. 90 tablet 0   Phentermine HCl 8 MG TABS Take 1 tablet by mouth daily. 30 tablet  0   topiramate (TOPAMAX) 50 MG tablet Take 1 tablet (50 mg total) by mouth daily. 30 tablet 0   No facility-administered medications prior to visit.    Allergies  Allergen Reactions   Amoxicillin Rash   Penicillins Rash    ROS Review of Systems Review of Systems:  A fourteen system review of systems was performed and found to be positive as per HPI.    Objective:    Physical Exam General:  Well Developed, well nourished, appropriate for stated age.  Neuro:  Alert and oriented,  extra-ocular muscles intact  HEENT:  Normocephalic, atraumatic, neck supple  Skin:  no gross rash, warm, pink. Cardiac:  RRR, S1 S2 wnl's Respiratory: CTA B/L w/o wheezing, crackles or rales. Vascular:  Ext warm, no cyanosis apprec.; cap RF less 2 sec. Psych:  No HI/SI,  judgement and insight good, Euthymic mood. Full Affect.  BP 139/78    Pulse 79    Temp 98.2 F (36.8 C)    Ht $R'6\' 2"'tR$  (1.88 m)    Wt (!) 408 lb (185.1 kg)    SpO2 98%    BMI 52.38 kg/m  Wt Readings from Last 3 Encounters:  01/30/22 (!) 408 lb (185.1 kg)  12/14/21 (!) 407 lb (184.6 kg)  09/12/21 (!) 409 lb (185.5 kg)     Health Maintenance Due  Topic Date Due   COVID-19 Vaccine (1) Never done   HIV Screening  Never done   Hepatitis C Screening  Never done   INFLUENZA VACCINE  Never done    There are no preventive care reminders to display for this patient.  Lab Results  Component Value Date   TSH 0.759 04/06/2021   Lab Results  Component Value Date   WBC 9.2 04/06/2021   HGB 15.9 04/06/2021   HCT 46.0 04/06/2021   MCV 90 04/06/2021   PLT 232 04/06/2021   Lab Results  Component Value Date   NA 143 04/06/2021   K 4.4 04/06/2021   CO2 25 04/06/2021   GLUCOSE 108 (H) 04/06/2021   BUN 17 04/06/2021   CREATININE 1.01 04/06/2021   BILITOT 0.3 04/06/2021   ALKPHOS 80 04/06/2021   AST 30 04/06/2021   ALT 62 (H) 04/06/2021   PROT 7.4 04/06/2021   ALBUMIN 4.2 04/06/2021   CALCIUM 9.7 04/06/2021   EGFR 99 04/06/2021   Lab Results  Component Value Date   CHOL 157 10/11/2016   Lab Results  Component Value Date   HDL 40 10/11/2016   Lab Results  Component Value Date   LDLCALC 94 10/11/2016   Lab Results  Component Value Date   TRIG 116 10/11/2016   Lab Results  Component Value Date   CHOLHDL 3.9 10/11/2016   Lab Results  Component Value Date   HGBA1C 5.3 04/06/2021      Assessment & Plan:   Problem List Items Addressed This Visit       Cardiovascular and Mediastinum   HTN (hypertension), benign (Chronic)    -BP initially elevated, BP repeated and improved. Continue current medication regimen. See med list. -Recommend to continue weight loss efforts. -Will continue to monitor.      Relevant Medications   losartan-hydrochlorothiazide (HYZAAR)  100-12.5 MG tablet     Other   Morbid obesity ([place]) - Primary    -Pt gained a pound since last visit. Will increase phentermine-topiramate to 15-100 mg. Encourage to continue with dietary changes and exercise including aerobic.  -Follow up in 8 weeks for  medication management.      Relevant Medications   phentermine 15 MG capsule   topiramate (TOPAMAX) 100 MG tablet   Other Visit Diagnoses     Polyarthralgia       Relevant Medications   diclofenac (VOLTAREN) 75 MG EC tablet      Polyarthralgia: -Discussed non-pharmacologic therapy including to continue with aerobic exercise and exercise. Continue diclofenac 75 mg as needed for moderate pain relief.  Meds ordered this encounter  Medications   phentermine 15 MG capsule    Sig: Take 1 capsule (15 mg total) by mouth every morning.    Dispense:  30 capsule    Refill:  0    Order Specific Question:   Supervising Provider    Answer:   Beatrice Lecher D [[id number]]   topiramate (TOPAMAX) 100 MG tablet    Sig: Take 1 tablet (100 mg total) by mouth 2 (two) times daily.    Dispense:  30 tablet    Refill:  0    Order Specific Question:   Supervising Provider    Answer:   Hali Marry [[id number]]   diclofenac (VOLTAREN) 75 MG EC tablet    Sig: Take 1 tablet (75 mg total) by mouth 2 (two) times daily.    Dispense:  60 tablet    Refill:  0    Order Specific Question:   Supervising Provider    Answer:   Hali Marry [[id number]]   losartan-hydrochlorothiazide (HYZAAR) 100-12.5 MG tablet    Sig: Take 1 tablet by mouth daily.    Dispense:  90 tablet    Refill:  0    Order Specific Question:   Supervising Provider    Answer:   Beatrice Lecher D [[id number]]    Follow-up: Return in about 8 weeks (around 03/27/2022) for weight.    Lorrene Reid, PA-C

## 2022-01-30 ENCOUNTER — Encounter: Payer: Self-pay | Admitting: Physician Assistant

## 2022-01-30 ENCOUNTER — Other Ambulatory Visit: Payer: Self-pay

## 2022-01-30 ENCOUNTER — Ambulatory Visit (INDEPENDENT_AMBULATORY_CARE_PROVIDER_SITE_OTHER): Payer: BC Managed Care – PPO | Admitting: Physician Assistant

## 2022-01-30 DIAGNOSIS — I10 Essential (primary) hypertension: Secondary | ICD-10-CM | POA: Diagnosis not present

## 2022-01-30 DIAGNOSIS — M2550 Pain in unspecified joint: Secondary | ICD-10-CM

## 2022-01-30 MED ORDER — LOSARTAN POTASSIUM-HCTZ 100-12.5 MG PO TABS: 1.0000 | ORAL_TABLET | Freq: Every day | ORAL | 0 refills | Status: DC

## 2022-01-30 MED ORDER — DICLOFENAC SODIUM 75 MG PO TBEC: 75.0000 mg | DELAYED_RELEASE_TABLET | Freq: Two times a day (BID) | ORAL | 0 refills | Status: DC

## 2022-01-30 MED ORDER — TOPIRAMATE 100 MG PO TABS
100.0000 mg | ORAL_TABLET | Freq: Two times a day (BID) | ORAL | 0 refills | Status: DC
Start: 2022-01-30 — End: 2022-03-28

## 2022-01-30 MED ORDER — PHENTERMINE HCL 15 MG PO CAPS: 15.0000 mg | ORAL_CAPSULE | ORAL | 0 refills | Status: DC

## 2022-01-30 NOTE — Patient Instructions (Signed)

## 2022-01-30 NOTE — Assessment & Plan Note (Addendum)
-  Pt gained a pound since last visit. Will increase phentermine-topiramate to 15-100 mg. Encourage to continue with dietary changes and exercise including aerobic.  -Follow up in 8 weeks for medication management.

## 2022-01-30 NOTE — Assessment & Plan Note (Signed)
-  BP initially elevated, BP repeated and improved. Continue current medication regimen. See med list. -Recommend to continue weight loss efforts. -Will continue to monitor.

## 2022-03-21 ENCOUNTER — Ambulatory Visit: Payer: BC Managed Care – PPO | Admitting: Physician Assistant

## 2022-03-21 NOTE — Progress Notes (Deleted)
? ?Established Patient Office Visit ? ?Subjective:  ?Patient ID: Jamie Newton, male    DOB: [birth year]  Age: 36 y.o. MRN: [id number] ? ?CC: No chief complaint on file. ? ? ?HPI ?Dagmar Hait presents for *** ? ?Past Medical History:  ?Diagnosis Date  ? Allergy   ? SEASONAL  ? Asthma   ? Hypertension   ? ? ?Past Surgical History:  ?Procedure Laterality Date  ? APPENDECTOMY    ? ? ?Family History  ?Problem Relation Age of Onset  ? Healthy Brother   ? Asthma Daughter   ? Arthritis Maternal Grandmother   ? Healthy Maternal Grandfather   ? Early death Paternal Grandfather   ?     unknown  ? Healthy Brother   ? Healthy Daughter   ? ? ?Social History  ? ?Socioeconomic History  ? Marital status: Married  ?  Spouse name: Asia Favata  ? Number of children: 2  ? Years of education: Not on file  ? Highest education level: Not on file  ?Occupational History  ? Occupation: Dance movement psychotherapist  ?  Employer: [id number]  ?Tobacco Use  ? Smoking status: Never  ? Smokeless tobacco: Never  ?Substance and Sexual Activity  ? Alcohol use: Yes  ?  Alcohol/week: 1.0 standard drink  ?  Types: 1 Cans of beer per week  ? Drug use: No  ? Sexual activity: Yes  ?Other Topics Concern  ? Not on file  ?Social History Narrative  ? Not on file  ? ?Social Determinants of Health  ? ?Financial Resource Strain: Not on file  ?Food Insecurity: Not on file  ?Transportation Needs: Not on file  ?Physical Activity: Not on file  ?Stress: Not on file  ?Social Connections: Not on file  ?Intimate Partner Violence: Not on file  ? ? ?Outpatient Medications Prior to Visit  ?Medication Sig Dispense Refill  ? acetaminophen (TYLENOL) 650 MG CR tablet Take 650 mg by mouth every 8 (eight) hours as needed for pain.    ? Cholecalciferol (VITAMIN D3) 5000 units TABS 5,000 IU OTC vitamin D3 daily. 90 tablet 12  ? diclofenac (VOLTAREN) 75 MG EC tablet Take 1 tablet (75 mg total) by mouth 2 (two) times daily. 60 tablet 0  ? losartan-hydrochlorothiazide (HYZAAR) 100-12.5 MG tablet  Take 1 tablet by mouth daily. 90 tablet 0  ? Misc Natural Products (OSTEO BI-FLEX JOINT SHIELD) TABS Take 1 tablet by mouth daily.    ? nebivolol (BYSTOLIC) 5 MG tablet Take 1 tablet (5 mg total) by mouth daily. 90 tablet 0  ? phentermine 15 MG capsule Take 1 capsule (15 mg total) by mouth every morning. 30 capsule 0  ? topiramate (TOPAMAX) 100 MG tablet Take 1 tablet (100 mg total) by mouth 2 (two) times daily. 30 tablet 0  ? ?No facility-administered medications prior to visit.  ? ? ?Allergies  ?Allergen Reactions  ? Amoxicillin Rash  ? Penicillins Rash  ? ? ?ROS ?Review of Systems ? ?  ?Objective:  ?  ?Physical Exam ? ?There were no vitals taken for this visit. ?Wt Readings from Last 3 Encounters:  ?01/30/22 (!) 408 lb (185.1 kg)  ?12/14/21 (!) 407 lb (184.6 kg)  ?09/12/21 (!) 409 lb (185.5 kg)  ? ? ? ?Health Maintenance Due  ?Topic Date Due  ? COVID-19 Vaccine (1) Never done  ? HIV Screening  Never done  ? Hepatitis C Screening  Never done  ? ? ?There are no preventive care reminders to display for  this patient. ? ?Lab Results  ?Component Value Date  ? TSH 0.759 04/06/2021  ? ?Lab Results  ?Component Value Date  ? WBC 9.2 04/06/2021  ? HGB 15.9 04/06/2021  ? HCT 46.0 04/06/2021  ? MCV 90 04/06/2021  ? PLT 232 04/06/2021  ? ?Lab Results  ?Component Value Date  ? NA 143 04/06/2021  ? K 4.4 04/06/2021  ? CO2 25 04/06/2021  ? GLUCOSE 108 (H) 04/06/2021  ? BUN 17 04/06/2021  ? CREATININE 1.01 04/06/2021  ? BILITOT 0.3 04/06/2021  ? ALKPHOS 80 04/06/2021  ? AST 30 04/06/2021  ? ALT 62 (H) 04/06/2021  ? PROT 7.4 04/06/2021  ? ALBUMIN 4.2 04/06/2021  ? CALCIUM 9.7 04/06/2021  ? EGFR 99 04/06/2021  ? ?Lab Results  ?Component Value Date  ? CHOL 157 10/11/2016  ? ?Lab Results  ?Component Value Date  ? HDL 40 10/11/2016  ? ?Lab Results  ?Component Value Date  ? [place] 94 10/11/2016  ? ?Lab Results  ?Component Value Date  ? TRIG 116 10/11/2016  ? ?Lab Results  ?Component Value Date  ? CHOLHDL 3.9 10/11/2016  ? ?Lab Results   ?Component Value Date  ? HGBA1C 5.3 04/06/2021  ? ? ?  ?Assessment & Plan:  ? ?Problem List Items Addressed This Visit   ?None ? ? ?No orders of the defined types were placed in this encounter. ? ? ?Follow-up: No follow-ups on file.  ? ? ?Aron Baba, [employer] ?

## 2022-03-27 ENCOUNTER — Ambulatory Visit: Payer: BC Managed Care – PPO | Admitting: Physician Assistant

## 2022-03-27 NOTE — Progress Notes (Signed)
? ?Established Patient Office Visit ? ?Subjective:  ?Patient ID: Jamie Newton, male    DOB: 10-13-1986  Age: 36 y.o. MRN: [id number] ? ?CC:  ?Chief Complaint  ?Patient presents with  ? Follow-up  ? Weight Loss  ? Hypertension  ? ? ?HPI ?Jamie Newton presents for follow-up on weight management and hypertension. Diet- continues with meal prep and has a protein snack or eats nuts when feeling hungry or needs a snack. Has reduced red meat and increased chicken and fish intake. Trying to limit fast foods, pasta and rice. Patient has c/o left ankle pain x 3 days. States started running within the past 2 weeks. No fall, injury or twist. Pain radiates to medial arch. Also has stiffness. At work is on his feet all day. Diclofenac in the past has provided relief. Patient does not regularly check blood pressure. Denies chest pain, palpitations, shortness of breath or dizziness.  ? ?Past Medical History:  ?Diagnosis Date  ? Allergy   ? SEASONAL  ? Asthma   ? Hypertension   ? ? ?Past Surgical History:  ?Procedure Laterality Date  ? APPENDECTOMY    ? ? ?Family History  ?Problem Relation Age of Onset  ? Healthy Brother   ? Asthma Daughter   ? Arthritis Maternal Grandmother   ? Healthy Maternal Grandfather   ? Early death Paternal Grandfather   ?     unknown  ? Healthy Brother   ? Healthy Daughter   ? ? ?Social History  ? ?Socioeconomic History  ? Marital status: Married  ?  Spouse name: Kevaughn Ewing  ? Number of children: 2  ? Years of education: Not on file  ? Highest education level: Not on file  ?Occupational History  ? Occupation: Dance movement psychotherapist  ?  Employer: [employer]  ?Tobacco Use  ? Smoking status: Never  ? Smokeless tobacco: Never  ?Substance and Sexual Activity  ? Alcohol use: Yes  ?  Alcohol/week: 1.0 standard drink  ?  Types: 1 Cans of beer per week  ? Drug use: No  ? Sexual activity: Yes  ?Other Topics Concern  ? Not on file  ?Social History Narrative  ? Not on file  ? ?Social Determinants of Health  ? ?Financial  Resource Strain: Not on file  ?Food Insecurity: Not on file  ?Transportation Needs: Not on file  ?Physical Activity: Not on file  ?Stress: Not on file  ?Social Connections: Not on file  ?Intimate Partner Violence: Not on file  ? ? ?Outpatient Medications Prior to Visit  ?Medication Sig Dispense Refill  ? acetaminophen (TYLENOL) 650 MG CR tablet Take 650 mg by mouth every 8 (eight) hours as needed for pain.    ? Cholecalciferol (VITAMIN D3) 5000 units TABS 5,000 IU OTC vitamin D3 daily. 90 tablet 12  ? Misc Natural Products (OSTEO BI-FLEX JOINT SHIELD) TABS Take 1 tablet by mouth daily.    ? diclofenac (VOLTAREN) 75 MG EC tablet Take 1 tablet (75 mg total) by mouth 2 (two) times daily. 60 tablet 0  ? losartan-hydrochlorothiazide (HYZAAR) 100-12.5 MG tablet Take 1 tablet by mouth daily. 90 tablet 0  ? nebivolol (BYSTOLIC) 5 MG tablet Take 1 tablet (5 mg total) by mouth daily. 90 tablet 0  ? phentermine 15 MG capsule Take 1 capsule (15 mg total) by mouth every morning. 30 capsule 0  ? topiramate (TOPAMAX) 100 MG tablet Take 1 tablet (100 mg total) by mouth 2 (two) times daily. 30 tablet 0  ? ?  No facility-administered medications prior to visit.  ? ? ?Allergies  ?Allergen Reactions  ? Amoxicillin Rash  ? Penicillins Rash  ? ? ?ROS ?Review of Systems ?Review of Systems:  ?A fourteen system review of systems was performed and found to be positive as per HPI. ? ?  ?Objective:  ?  ?Physical Exam ?General:  Cooperative, in no acute distress, appropriate for stated age.  ?Neuro:  Alert and oriented,  extra-ocular muscles intact  ?HEENT:  Normocephalic, atraumatic, neck supple  ?Skin:  no gross rash, warm, pink. ?Cardiac:  RRR, S1 S2 ?Respiratory: CTA B/L w/o wheezing. ?MSK: tenderness of left ankle especially over calcaneofibular ligament, mild swelling of left when compared to the right ankle, good ROM of both ankles, no deformity noted ?Vascular:  Ext warm, no cyanosis apprec.; cap RF less 2 sec. ?Psych:  No HI/SI,  judgement and insight good, Euthymic mood. Full Affect. ? ?BP 113/75   Pulse 64   Temp 98 ?F (36.7 ?C)   Ht $R'6\' 2"'xD$  (1.88 m)   Wt (!) 398 lb (180.5 kg)   SpO2 98%   BMI 51.10 kg/m?  ?Wt Readings from Last 3 Encounters:  ?03/28/22 (!) 398 lb (180.5 kg)  ?01/30/22 (!) 408 lb (185.1 kg)  ?12/14/21 (!) 407 lb (184.6 kg)  ? ? ? ?Health Maintenance Due  ?Topic Date Due  ? COVID-19 Vaccine (1) Never done  ? HIV Screening  Never done  ? Hepatitis C Screening  Never done  ? ? ?There are no preventive care reminders to display for this patient. ? ?Lab Results  ?Component Value Date  ? TSH 0.759 04/06/2021  ? ?Lab Results  ?Component Value Date  ? WBC 9.2 04/06/2021  ? HGB 15.9 04/06/2021  ? HCT 46.0 04/06/2021  ? MCV 90 04/06/2021  ? PLT 232 04/06/2021  ? ?Lab Results  ?Component Value Date  ? NA 143 04/06/2021  ? K 4.4 04/06/2021  ? CO2 25 04/06/2021  ? GLUCOSE 108 (H) 04/06/2021  ? BUN 17 04/06/2021  ? CREATININE 1.01 04/06/2021  ? BILITOT 0.3 04/06/2021  ? ALKPHOS 80 04/06/2021  ? AST 30 04/06/2021  ? ALT 62 (H) 04/06/2021  ? PROT 7.4 04/06/2021  ? ALBUMIN 4.2 04/06/2021  ? CALCIUM 9.7 04/06/2021  ? EGFR 99 04/06/2021  ? ?Lab Results  ?Component Value Date  ? CHOL 157 10/11/2016  ? ?Lab Results  ?Component Value Date  ? HDL 40 10/11/2016  ? ?Lab Results  ?Component Value Date  ? [place] 94 10/11/2016  ? ?Lab Results  ?Component Value Date  ? TRIG 116 10/11/2016  ? ?Lab Results  ?Component Value Date  ? CHOLHDL 3.9 10/11/2016  ? ?Lab Results  ?Component Value Date  ? HGBA1C 5.3 04/06/2021  ? ? ?  ?Assessment & Plan:  ? ?Problem List Items Addressed This Visit   ? ?  ? Cardiovascular and Mediastinum  ? HTN (hypertension), benign (Chronic)  ?  -BP today is at goal. Continue current medication regimen. Provided refills. Continue weight loss efforts. Will continue to monitor. ?  ?  ? Relevant Medications  ? losartan-hydrochlorothiazide (HYZAAR) 100-12.5 MG tablet  ? nebivolol (BYSTOLIC) 5 MG tablet  ?  ? Other  ? Morbid  obesity ([place])  ?  -Patient has lost 10 pounds since last visit and a total of 11 pounds since restarting phentermine-topiramate (09/12/2021). Weight back in 04/06/2021 was 422 pounds. Discussed with patient alternative treatment options and advised to contact his insurance and inquire about medication  coverage for obesity such as Wegovy. Patient has cardiovascular risk factors and would benefit from continued weight loss. Previously has also tried phentermine and Weight Watcher's without long-term success. Recommend to continue with diet changes and increased exercise. Will continue current medication therapy with phentermine-topiramate and if [facility] covered then recommend changing medication therapy. Pt verbalized understanding. ?  ?  ? Relevant Medications  ? phentermine 15 MG capsule  ? topiramate (TOPAMAX) 100 MG tablet  ? ?Other Visit Diagnoses   ? ? Acute left ankle pain    -  Primary  ? Relevant Medications  ? methylPREDNISolone (MEDROL DOSEPAK) 4 MG TBPK tablet  ? Polyarthralgia      ? Relevant Medications  ? diclofenac (VOLTAREN) 75 MG EC tablet  ? ?  ? ?Acute right ankle pain: ?-No history of injury so less likely sprain, s/sx more suggestive of ankle tendonitis. Will start corticosteroid therapy to help reduce inflammation and improve symptoms. Also recommend ice therapy and elevation. Recommend to follow-up with orthopedist if symptoms fail to improve or worsen.  ? ?Meds ordered this encounter  ?Medications  ? methylPREDNISolone (MEDROL DOSEPAK) 4 MG TBPK tablet  ?  Sig: Take as directed on package.  ?  Dispense:  21 tablet  ?  Refill:  0  ?  Order Specific Question:   Supervising Provider  ?  Answer:   Beatrice Lecher D [[id number]]  ? diclofenac (VOLTAREN) 75 MG EC tablet  ?  Sig: Take 1 tablet (75 mg total) by mouth 2 (two) times daily.  ?  Dispense:  60 tablet  ?  Refill:  0  ?  Order Specific Question:   Supervising Provider  ?  Answer:   Beatrice Lecher D [[id number]]  ? losartan-hydrochlorothiazide  (HYZAAR) 100-12.5 MG tablet  ?  Sig: Take 1 tablet by mouth daily.  ?  Dispense:  90 tablet  ?  Refill:  1  ?  Order Specific Question:   Supervising Provider  ?  Answer:   Beatrice Lecher D [[id number]]  ? nebivolol (BYSTOLIC

## 2022-03-28 ENCOUNTER — Encounter: Payer: Self-pay | Admitting: Physician Assistant

## 2022-03-28 ENCOUNTER — Ambulatory Visit (INDEPENDENT_AMBULATORY_CARE_PROVIDER_SITE_OTHER): Payer: BC Managed Care – PPO | Admitting: Physician Assistant

## 2022-03-28 VITALS — BP 113/75 | HR 64 | Temp 98.0°F | Ht 74.0 in | Wt 398.0 lb

## 2022-03-28 DIAGNOSIS — I10 Essential (primary) hypertension: Secondary | ICD-10-CM

## 2022-03-28 DIAGNOSIS — M25572 Pain in left ankle and joints of left foot: Secondary | ICD-10-CM | POA: Diagnosis not present

## 2022-03-28 DIAGNOSIS — M2550 Pain in unspecified joint: Secondary | ICD-10-CM | POA: Diagnosis not present

## 2022-03-28 MED ORDER — LOSARTAN POTASSIUM-HCTZ 100-12.5 MG PO TABS: 1.0000 | ORAL_TABLET | Freq: Every day | ORAL | 1 refills | Status: DC

## 2022-03-28 MED ORDER — METHYLPREDNISOLONE 4 MG PO TBPK: ORAL_TABLET | ORAL | 0 refills | Status: DC

## 2022-03-28 MED ORDER — TOPIRAMATE 100 MG PO TABS: 100.0000 mg | ORAL_TABLET | Freq: Every day | ORAL | 0 refills | Status: DC

## 2022-03-28 MED ORDER — DICLOFENAC SODIUM 75 MG PO TBEC: 75.0000 mg | DELAYED_RELEASE_TABLET | Freq: Two times a day (BID) | ORAL | 0 refills | Status: DC

## 2022-03-28 MED ORDER — NEBIVOLOL HCL 5 MG PO TABS: 5.0000 mg | ORAL_TABLET | Freq: Every day | ORAL | 1 refills | Status: DC

## 2022-03-28 MED ORDER — PHENTERMINE HCL 15 MG PO CAPS: 15.0000 mg | ORAL_CAPSULE | ORAL | 0 refills | Status: DC

## 2022-03-28 NOTE — Assessment & Plan Note (Signed)
-  Patient has lost 10 pounds since last visit and a total of 11 pounds since restarting phentermine-topiramate (09/12/2021). Weight back in 04/06/2021 was 422 pounds. Discussed with patient alternative treatment options and advised to contact his insurance and inquire about medication coverage for obesity such as Wegovy. Patient has cardiovascular risk factors and would benefit from continued weight loss. Previously has also tried phentermine and Weight Watcher's without long-term success. Recommend to continue with diet changes and increased exercise. Will continue current medication therapy with phentermine-topiramate and if [facility] covered then recommend changing medication therapy. Pt verbalized understanding. ?

## 2022-03-28 NOTE — Assessment & Plan Note (Addendum)
-  BP today is at goal. Continue current medication regimen. Provided refills. Continue weight loss efforts. Will continue to monitor. ?

## 2022-05-05 NOTE — Patient Instructions (Signed)
How to Take Your Blood Pressure Blood pressure measures how strongly your blood is pressing against the walls of your arteries. Arteries are blood vessels that carry blood from your heart throughout your body. You can take your blood pressure at home with a machine. You may need to check your blood pressure at home: To check if you have high blood pressure (hypertension). To check your blood pressure over time. To make sure your blood pressure medicine is working. Supplies needed: Blood pressure machine, or monitor. A chair to sit in. This should be a chair where you can sit upright with your back supported. Do not sit on a soft couch or an armchair. Table or desk. Small notebook. Pencil or pen. How to prepare Avoid these things for 30 minutes before checking your blood pressure: Having drinks with caffeine in them, such as coffee or tea. Drinking alcohol. Eating. Smoking. Exercising. Do these things five minutes before checking your blood pressure: Go to the bathroom and pee (urinate). Sit in a chair. Be quiet. Do not talk. How to take your blood pressure Follow the instructions that came with your machine. If you have a digital blood pressure monitor, these may be the instructions: Sit up straight. Place your feet on the floor. Do not cross your ankles or legs. Rest your left arm at the level of your heart. You may rest it on a table, desk, or chair. Pull up your shirt sleeve. Wrap the blood pressure cuff around the upper part of your left arm. The cuff should be 1 inch (2.5 cm) above your elbow. It is best to wrap the cuff around bare skin. Fit the cuff snugly around your arm, but not too tightly. You should be able to place only one finger between the cuff and your arm. Place the cord so that it rests in the bend of your elbow. Press the power button. Sit quietly while the cuff fills with air and loses air. Write down the numbers on the screen. Wait 2-3 minutes and then repeat  steps 1-10. What do the numbers mean? Two numbers make up your blood pressure. The first number is called systolic pressure. The second is called diastolic pressure. An example of a blood pressure reading is "120 over 80" (or 120/80). If you are an adult and do not have a medical condition, use this guide to find out if your blood pressure is normal: Normal First number: below 120. Second number: below 80. Elevated First number: 120-129. Second number: below 80. Hypertension stage 1 First number: 130-139. Second number: 80-89. Hypertension stage 2 First number: 140 or above. Second number: 90 or above. Your blood pressure is above normal even if only the first or only the second number is above normal. Follow these instructions at home: Medicines Take over-the-counter and prescription medicines only as told by your doctor. Tell your doctor if your medicine is causing side effects. General instructions Check your blood pressure as often as your doctor tells you to. Check your blood pressure at the same time every day. Take your monitor to your next doctor's appointment. Your doctor will: Make sure you are using it correctly. Make sure it is working right. Understand what your blood pressure numbers should be. Keep all follow-up visits. General tips You will need a blood pressure machine or monitor. Your doctor can suggest a monitor. You can buy one at a drugstore or online. When choosing one: Choose one with an arm cuff. Choose one that wraps around your   upper arm. Only one finger should fit between your arm and the cuff. Do not choose one that measures your blood pressure from your wrist or finger. Where to find more information American Heart Association: www.heart.org Contact a doctor if: Your blood pressure keeps being high. Your blood pressure is suddenly low. Get help right away if: Your first blood pressure number is higher than 180. Your second blood pressure number is  higher than 120. These symptoms may be an emergency. Do not wait to see if the symptoms will go away. Get help right away. Call 911. Summary Check your blood pressure at the same time every day. Avoid caffeine, alcohol, smoking, and exercise for 30 minutes before checking your blood pressure. Make sure you understand what your blood pressure numbers should be. This information is not intended to replace advice given to you by your health care provider. Make sure you discuss any questions you have with your health care provider. Document Revised: 08/18/2021 Document Reviewed: 08/18/2021 Elsevier Patient Education  2023 Elsevier Inc.  

## 2022-05-11 ENCOUNTER — Ambulatory Visit: Payer: BC Managed Care – PPO | Admitting: Physician Assistant

## 2022-05-11 ENCOUNTER — Encounter: Payer: Self-pay | Admitting: Physician Assistant

## 2022-05-11 DIAGNOSIS — I10 Essential (primary) hypertension: Secondary | ICD-10-CM

## 2022-05-11 MED ORDER — PHENTERMINE HCL 15 MG PO CAPS: 15.0000 mg | ORAL_CAPSULE | ORAL | 0 refills | Status: DC

## 2022-05-11 MED ORDER — TOPIRAMATE 100 MG PO TABS: 100.0000 mg | ORAL_TABLET | Freq: Every day | ORAL | 0 refills | Status: DC

## 2022-05-11 NOTE — Progress Notes (Signed)
Established patient visit   Patient: Jamie Newton   DOB: 28-Jun-1986   36 y.o. Male  MRN: [id number] Visit Date: 05/11/2022  Chief Complaint  Patient presents with   Follow-up   Subjective    HPI  Patient presents for follow-up on weight management and hypertension. Patient reports continues with diet changes which includes smaller meals and lean meats. Continues to work-out 3-4 times per week. Pt denies chest pain, palpitations, dizziness or lower extremity swelling. Taking medication as directed without side effects. States has been a stressful and hectic day at work. 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       Medications: Outpatient Medications Prior to Visit  Medication Sig   acetaminophen (TYLENOL) 650 MG CR tablet Take 650 mg by mouth every 8 (eight) hours as needed for pain.   Cholecalciferol (VITAMIN D3) 5000 units TABS 5,000 IU OTC vitamin D3 daily.   diclofenac (VOLTAREN) 75 MG EC tablet Take 1 tablet (75 mg total) by mouth 2 (two) times daily.   losartan-hydrochlorothiazide (HYZAAR) 100-12.5 MG tablet Take 1 tablet by mouth daily.   methylPREDNISolone (MEDROL DOSEPAK) 4 MG TBPK tablet Take as directed on package.   Misc Natural Products (OSTEO BI-FLEX JOINT SHIELD) TABS Take 1 tablet by mouth daily.   nebivolol (BYSTOLIC) 5 MG tablet Take 1 tablet (5 mg total) by mouth daily.   [DISCONTINUED] phentermine 15 MG capsule Take 1 capsule (15 mg total) by mouth every morning.   [DISCONTINUED] topiramate (TOPAMAX) 100 MG tablet Take 1 tablet (100 mg total) by mouth daily.   No facility-administered medications prior to visit.    Review of Systems Review of Systems:  A fourteen system review of systems was performed and found to be positive as per HPI.  Last CBC Lab Results  Component Value Date   WBC 9.2 04/06/2021   HGB 15.9 04/06/2021   HCT 46.0 04/06/2021   MCV 90 04/06/2021   MCH 31.2 04/06/2021   RDW 13.2 04/06/2021   PLT 232 62/22/9798   Last metabolic  panel Lab Results  Component Value Date   GLUCOSE 108 (H) 04/06/2021   NA 143 04/06/2021   K 4.4 04/06/2021   CL 101 04/06/2021   CO2 25 04/06/2021   BUN 17 04/06/2021   CREATININE 1.01 04/06/2021   EGFR 99 04/06/2021   CALCIUM 9.7 04/06/2021   PROT 7.4 04/06/2021   ALBUMIN 4.2 04/06/2021   LABGLOB 3.2 04/06/2021   AGRATIO 1.3 04/06/2021   BILITOT 0.3 04/06/2021   ALKPHOS 80 04/06/2021   AST 30 04/06/2021   ALT 62 (H) 04/06/2021   Last lipids Lab Results  Component Value Date   CHOL 157 10/11/2016   HDL 40 10/11/2016   LDLCALC 94 10/11/2016   LDLDIRECT 95 04/06/2021   TRIG 116 10/11/2016   CHOLHDL 3.9 10/11/2016   Last hemoglobin A1c Lab Results  Component Value Date   HGBA1C 5.3 04/06/2021   Last thyroid functions Lab Results  Component Value Date   TSH 0.759 04/06/2021   Last vitamin D Lab Results  Component Value Date   VD25OH 35 10/11/2016   Last vitamin B12 and Folate No results found for: VITAMINB12, FOLATE   Objective    BP (!) 152/90   Pulse 80   Ht $R'6\' 2"'SQ$  (1.88 m)   Wt (!) 377 lb 0.4 oz (171 kg)   SpO2 95%   BMI 48.41 kg/m  BP Readings from Last 3 Encounters:  05/11/22 (!) 152/90  03/28/22 113/75  01/30/22 139/78  Wt Readings from Last 3 Encounters:  05/11/22 (!) 377 lb 0.4 oz (171 kg)  03/28/22 (!) 398 lb (180.5 kg)  01/30/22 (!) 408 lb (185.1 kg)    Physical Exam  General:  Cooperative, in no acute distress, appropriate for stated age.  Neuro:  Alert and oriented,  extra-ocular muscles intact  HEENT:  Normocephalic, atraumatic, neck supple  Skin:  no gross rash, warm, pink. Cardiac:  RRR, S1 S2 Respiratory: CTA B/L  Vascular:  Ext warm, no cyanosis apprec.; cap RF less 2 sec. Psych:  No HI/SI, judgement and insight good, Euthymic mood. Full Affect.   No results found for any visits on 05/11/22.  Assessment & Plan      Problem List Items Addressed This Visit       Cardiovascular and Mediastinum   HTN (hypertension),  benign (Chronic)    -BP elevated in office today. BP at last visit in April 2023 was normal. Will continue current medication regimen. Will continue to monitor and repeat BP at follow-up visit.          Other   Morbid obesity ([place]) - Primary    -Patient has lost 21 pounds since last visit and a total of 48 pounds since starting medication therapy in conjunction with diet changes and exercise. Will continue current medication regimen. Advised we can try changing to [facility] if insurance covers medication. Pt verbalized understanding and prefers to wait on sending rx until next week. Will continue to monitor.       Relevant Medications   phentermine 15 MG capsule   topiramate (TOPAMAX) 100 MG tablet    Return in about 8 weeks (around 07/06/2022) for Wt, HTN.        Lorrene Reid, PA-C  [facility] at [facility] [phone] (phone) [phone] (fax)  [place]

## 2022-05-11 NOTE — Assessment & Plan Note (Signed)
-  BP elevated in office today. BP at last visit in April 2023 was normal. Will continue current medication regimen. Will continue to monitor and repeat BP at follow-up visit.

## 2022-05-11 NOTE — Assessment & Plan Note (Addendum)
-  Patient has lost 21 pounds since last visit and a total of 48 pounds since starting medication therapy in conjunction with diet changes and exercise. Will continue current medication regimen. Advised we can try changing to [facility] if insurance covers medication. Pt verbalized understanding and prefers to wait on sending rx until next week. Will continue to monitor.

## 2022-06-14 ENCOUNTER — Encounter: Payer: Self-pay | Admitting: Physician Assistant

## 2022-06-14 ENCOUNTER — Ambulatory Visit (INDEPENDENT_AMBULATORY_CARE_PROVIDER_SITE_OTHER): Payer: BC Managed Care – PPO | Admitting: Physician Assistant

## 2022-06-14 DIAGNOSIS — M25521 Pain in right elbow: Secondary | ICD-10-CM

## 2022-06-14 DIAGNOSIS — I10 Essential (primary) hypertension: Secondary | ICD-10-CM | POA: Diagnosis not present

## 2022-06-14 DIAGNOSIS — M2550 Pain in unspecified joint: Secondary | ICD-10-CM | POA: Diagnosis not present

## 2022-06-14 MED ORDER — WEGOVY 0.25 MG/0.5ML ~~LOC~~ SOAJ: 0.2500 mg | SUBCUTANEOUS | 0 refills | Status: DC

## 2022-06-14 MED ORDER — DICLOFENAC SODIUM 75 MG PO TBEC: 75.0000 mg | DELAYED_RELEASE_TABLET | Freq: Two times a day (BID) | ORAL | 1 refills | Status: DC

## 2022-06-14 NOTE — Patient Instructions (Signed)
Managing Stress, Adult Feeling a certain amount of stress is normal. Stress helps our body and mind get ready to deal with the demands of life. Stress hormones can motivate you to do well at work and meet your responsibilities. But severe or long-term (chronic) stress can affect your mental and physical health. Chronic stress puts you at higher risk for: Anxiety and depression. Other health problems such as digestive problems, muscle aches, heart disease, high blood pressure, and stroke. What are the causes? Common causes of stress include: Demands from work, such as deadlines, feeling overworked, or having long hours. Pressures at home, such as money issues, disagreements with a spouse, or parenting issues. Pressures from major life changes, such as divorce, moving, loss of a loved one, or chronic illness. You may be at higher risk for stress-related problems if you: Do not get enough sleep. Are in poor health. Do not have emotional support. Have a mental health disorder such as anxiety or depression. How to recognize stress Stress can make you: Have trouble sleeping. Feel sad, anxious, irritable, or overwhelmed. Lose your appetite. Overeat or want to eat unhealthy foods. Want to use drugs or alcohol. Stress can also cause physical symptoms, such as: Sore, tense muscles, especially in the shoulders and neck. Headaches. Trouble breathing. A faster heart rate. Stomach pain, nausea, or vomiting. Diarrhea or constipation. Trouble concentrating. Follow these instructions at home: Eating and drinking Eat a healthy diet. This includes: Eating foods that are high in fiber, such as beans, whole grains, and fresh fruits and vegetables. Limiting foods that are high in fat and processed sugars, such as fried or sweet foods. Do not skip meals or overeat. Drink enough fluid to keep your urine pale yellow. Alcohol use Do not drink alcohol if: Your health care provider tells you not to  drink. You are pregnant, may be pregnant, or are planning to become pregnant. Drinking alcohol is a way some people try to ease their stress. This can be dangerous, so if you drink alcohol: Limit how much you have to: 0-1 drink a day for women. 0-2 drinks a day for men. Know how much alcohol is in your drink. In the [place] one drink equals one 12 oz bottle of beer (355 mL), one 5 oz glass of wine (148 mL), or one 1 oz glass of hard liquor (44 mL). Activity  Include 30 minutes of exercise in your daily schedule. Exercise is a good stress reducer. Include time in your day for an activity that you find relaxing. Try taking a walk, going on a bike ride, reading a book, or listening to music. Schedule your time in a way that lowers stress, and keep a regular schedule. Focus on doing what is most important to get done. Lifestyle Identify the source of your stress and your reaction to it. See a therapist who can help you change unhelpful reactions. When there are stressful events: Talk about them with family, friends, or coworkers. Try to think realistically about stressful events and not ignore them or overreact. Try to find the positives in a stressful situation and not focus on the negatives. Cut back on responsibilities at work and home, if possible. Ask for help from friends or family members if you need it. Find ways to manage stress, such as: Mindfulness, meditation, or deep breathing. Yoga or tai chi. Progressive muscle relaxation. Spending time in nature. Doing art, playing music, or reading. Making time for fun activities. Spending time with family and friends. Get support  from family, friends, or spiritual resources. General instructions Get enough sleep. Try to go to sleep and get up at about the same time every day. Take over-the-counter and prescription medicines only as told by your health care provider. Do not use any products that contain nicotine or tobacco. These products  include cigarettes, chewing tobacco, and vaping devices, such as e-cigarettes. If you need help quitting, ask your health care provider. Do not use drugs or smoke to deal with stress. Keep all follow-up visits. This is important. Where to find support Talk with your health care provider about stress management or finding a support group. Find a therapist to work with you on your stress management techniques. Where to find more information [employer] on Mental Illness: www.nami.org American Psychological Association: TVStereos.ch Contact a health care provider if: Your stress symptoms get worse. You are unable to manage your stress at home. You are struggling to stop using drugs or alcohol. Get help right away if: You may be a danger to yourself or others. You have any thoughts of death or suicide. Get help right awayif you feel like you may hurt yourself or others, or have thoughts about taking your own life. Go to your nearest emergency room or: Call 911. Call the [place] at 289-837-1647 or 988 in the [place] This is open 24 hours a day. Text the Crisis Text Line at (726)508-3618. Summary Feeling a certain amount of stress is normal, but severe or long-term (chronic) stress can affect your mental and physical health. Chronic stress can put you at higher risk for anxiety, depression, and other health problems such as digestive problems, muscle aches, heart disease, high blood pressure, and stroke. You may be at higher risk for stress-related problems if you do not get enough sleep, are in poor health, lack emotional support, or have a mental health disorder such as anxiety or depression. Identify the source of your stress and your reaction to it. Try talking about stressful events with family, friends, or coworkers, finding a coping method, or getting support from spiritual resources. If you need more help, talk with your health care provider about finding a  support group or a mental health therapist. This information is not intended to replace advice given to you by your health care provider. Make sure you discuss any questions you have with your health care provider. Document Revised: 06/30/2021 Document Reviewed: 06/28/2021 Elsevier Patient Education  [place].

## 2022-06-14 NOTE — Progress Notes (Signed)
Established patient visit   Patient: Jamie Newton   DOB: 1986/10/20   36 y.o. Male  MRN: [id number] Visit Date: 06/14/2022  No chief complaint on file.  Subjective    HPI  Patient presents for follow-up on hypertension and weight. Patient reports right elbow locks up randomly few times per year. Has seen ortho in the past. Diclofenac helped in the past but as much currently.   Weight: Patient reports has lowered calorie intake. Continues with meal prep. Also using stationary bike 4-5 times per week and row machine almost daily.   HTN: Pt denies chest pain, palpitations, dizziness, syncope or lower extremity swelling. Taking medication as directed without side effects.     Medications: Outpatient Medications Prior to Visit  Medication Sig   acetaminophen (TYLENOL) 650 MG CR tablet Take 650 mg by mouth every 8 (eight) hours as needed for pain.   Cholecalciferol (VITAMIN D3) 5000 units TABS 5,000 IU OTC vitamin D3 daily.   losartan-hydrochlorothiazide (HYZAAR) 100-12.5 MG tablet Take 1 tablet by mouth daily.   methylPREDNISolone (MEDROL DOSEPAK) 4 MG TBPK tablet Take as directed on package.   Misc Natural Products (OSTEO BI-FLEX JOINT SHIELD) TABS Take 1 tablet by mouth daily.   nebivolol (BYSTOLIC) 5 MG tablet Take 1 tablet (5 mg total) by mouth daily.   phentermine 15 MG capsule Take 1 capsule (15 mg total) by mouth every morning.   topiramate (TOPAMAX) 100 MG tablet Take 1 tablet (100 mg total) by mouth daily.   [DISCONTINUED] diclofenac (VOLTAREN) 75 MG EC tablet Take 1 tablet (75 mg total) by mouth 2 (two) times daily.   No facility-administered medications prior to visit.    Review of Systems Review of Systems:  A fourteen system review of systems was performed and found to be positive as per HPI.  Last CBC Lab Results  Component Value Date   WBC 9.2 04/06/2021   HGB 15.9 04/06/2021   HCT 46.0 04/06/2021   MCV 90 04/06/2021   MCH 31.2 04/06/2021   RDW 13.2  04/06/2021   PLT 232 29/52/8413   Last metabolic panel Lab Results  Component Value Date   GLUCOSE 108 (H) 04/06/2021   NA 143 04/06/2021   K 4.4 04/06/2021   CL 101 04/06/2021   CO2 25 04/06/2021   BUN 17 04/06/2021   CREATININE 1.01 04/06/2021   EGFR 99 04/06/2021   CALCIUM 9.7 04/06/2021   PROT 7.4 04/06/2021   ALBUMIN 4.2 04/06/2021   LABGLOB 3.2 04/06/2021   AGRATIO 1.3 04/06/2021   BILITOT 0.3 04/06/2021   ALKPHOS 80 04/06/2021   AST 30 04/06/2021   ALT 62 (H) 04/06/2021   Last lipids Lab Results  Component Value Date   CHOL 157 10/11/2016   HDL 40 10/11/2016   LDLCALC 94 10/11/2016   LDLDIRECT 95 04/06/2021   TRIG 116 10/11/2016   CHOLHDL 3.9 10/11/2016   Last hemoglobin A1c Lab Results  Component Value Date   HGBA1C 5.3 04/06/2021   Last thyroid functions Lab Results  Component Value Date   TSH 0.759 04/06/2021     Objective    BP 119/75   Pulse 75   Temp 97.7 F (36.5 C)   Ht _0  (1.88 m)   Wt (!) 362 lb (164.2 kg)   SpO2 97%   BMI 46.48 kg/m  BP Readings from Last 3 Encounters:  06/14/22 119/75  05/11/22 (!) 152/90  03/28/22 113/75   Wt Readings from Last 3 Encounters:  06/14/22 Marland Kitchen)  362 lb (164.2 kg)  05/11/22 (!) 377 lb 0.4 oz (171 kg)  03/28/22 (!) 398 lb (180.5 kg)    Physical Exam  General:  Cooperative, in no acute distress, non-diaphoretic  Neuro:  Alert and oriented,  extra-ocular muscles intact  HEENT:  Normocephalic, atraumatic, neck supple  Skin:  no gross rash, warm, pink. Cardiac:  RRR, S1 S2 Respiratory: CTA B/L  MSK: tenderness of lateral epicondyle (right), limited ROM with extension, no deformity noted Vascular:  Ext warm, no cyanosis apprec.; cap RF less 2 sec. Psych:  No HI/SI, judgement and insight good, Euthymic mood. Full Affect.   No results found for any visits on 06/14/22.  Assessment & Plan      Problem List Items Addressed This Visit       Cardiovascular and Mediastinum   HTN (hypertension),  benign (Chronic)    -BP today is good. Continue current medication regimen. Will continue to monitor.        Other   Morbid obesity ([place]) - Primary    -Patient has cardiovascular risk factors and weight loss will help reduce risk. Praised patient for weight loss obtained so far with diet and lifestyle changes along with medication therapy (Phentermine-Topiramate). Patient has a history of hypertension with several BP readings elevated in the office so recommend changing medication to [facility]. Phentermine can contribute to elevated blood pressure. Patient verbalized understanding and agreeable. Recommend to continue with portion control, diet changes and exercise. Advised if unable to get [facility] then will continue with current medication regimen. Pt verbalized understanding. Recommend to follow-up in 8 weeks.       Relevant Medications   Semaglutide-Weight Management (WEGOVY) 0.25 MG/0.5ML SOAJ   Other Visit Diagnoses     Polyarthralgia       Relevant Medications   diclofenac (VOLTAREN) 75 MG EC tablet   Right elbow pain          Right elbow pain: -Recommend to continue with conservative therapy including the use of topical Voltaren gel. If symptoms fail to improve or worsen will consider steroid taper. Pt verbalized understanding.  Return in about 8 weeks (around 08/09/2022) for weight, HTN.        Lorrene Reid, PA-C  [facility] at [facility] [phone] (phone) [phone] (fax)  [place]

## 2022-06-14 NOTE — Assessment & Plan Note (Signed)
-  BP today is good. Continue current medication regimen. Will continue to monitor.

## 2022-06-14 NOTE — Assessment & Plan Note (Signed)
-  Patient has cardiovascular risk factors and weight loss will help reduce risk. Praised patient for weight loss obtained so far with diet and lifestyle changes along with medication therapy (Phentermine-Topiramate). Patient has a history of hypertension with several BP readings elevated in the office so recommend changing medication to [facility]. Phentermine can contribute to elevated blood pressure. Patient verbalized understanding and agreeable. Recommend to continue with portion control, diet changes and exercise. Advised if unable to get [facility] then will continue with current medication regimen. Pt verbalized understanding. Recommend to follow-up in 8 weeks.

## 2022-07-04 ENCOUNTER — Encounter: Payer: Self-pay | Admitting: Physician Assistant

## 2022-07-04 ENCOUNTER — Other Ambulatory Visit: Payer: Self-pay | Admitting: Physician Assistant

## 2022-07-04 MED ORDER — PHENTERMINE HCL 15 MG PO CAPS: 15.0000 mg | ORAL_CAPSULE | ORAL | 0 refills | Status: DC

## 2022-07-04 MED ORDER — TOPIRAMATE 100 MG PO TABS
100.0000 mg | ORAL_TABLET | Freq: Every day | ORAL | 0 refills | Status: DC
Start: 2022-07-04 — End: 2022-12-15

## 2022-08-02 ENCOUNTER — Ambulatory Visit: Payer: BC Managed Care – PPO | Admitting: Physician Assistant

## 2022-09-09 ENCOUNTER — Other Ambulatory Visit: Payer: Self-pay | Admitting: Physician Assistant

## 2022-09-09 DIAGNOSIS — I10 Essential (primary) hypertension: Secondary | ICD-10-CM

## 2022-09-29 ENCOUNTER — Other Ambulatory Visit: Payer: Self-pay | Admitting: Physician Assistant

## 2022-09-29 DIAGNOSIS — M2550 Pain in unspecified joint: Secondary | ICD-10-CM

## 2022-12-06 ENCOUNTER — Emergency Department (HOSPITAL_BASED_OUTPATIENT_CLINIC_OR_DEPARTMENT_OTHER)
Admit: 2022-12-06 | Discharge: 2022-12-06 | Disposition: A | Discharge status: Home / Self Care | Payer: BC Managed Care – PPO

## 2022-12-06 ENCOUNTER — Other Ambulatory Visit: Payer: Self-pay

## 2022-12-06 ENCOUNTER — Emergency Department (HOSPITAL_COMMUNITY)
Admission: EM | Admit: 2022-12-06 | Discharge: 2022-12-06 | Disposition: A | Discharge status: Home / Self Care | Payer: BC Managed Care – PPO | Attending: Emergency Medicine | Admitting: Emergency Medicine

## 2022-12-06 DIAGNOSIS — Z79899 Other long term (current) drug therapy: Secondary | ICD-10-CM | POA: Diagnosis not present

## 2022-12-06 DIAGNOSIS — I82411 Acute embolism and thrombosis of right femoral vein: Secondary | ICD-10-CM | POA: Insufficient documentation

## 2022-12-06 DIAGNOSIS — I10 Essential (primary) hypertension: Secondary | ICD-10-CM | POA: Insufficient documentation

## 2022-12-06 DIAGNOSIS — R52 Pain, unspecified: Secondary | ICD-10-CM | POA: Diagnosis not present

## 2022-12-06 DIAGNOSIS — Z7901 Long term (current) use of anticoagulants: Secondary | ICD-10-CM | POA: Insufficient documentation

## 2022-12-06 DIAGNOSIS — M25561 Pain in right knee: Secondary | ICD-10-CM | POA: Diagnosis not present

## 2022-12-06 MED ORDER — RIVAROXABAN (XARELTO) VTE STARTER PACK (15 & 20 MG): ORAL_TABLET | ORAL | 0 refills | Status: DC

## 2022-12-06 MED ORDER — RIVAROXABAN 15 MG PO TABS
15.0000 mg | ORAL_TABLET | Freq: Once | ORAL | Status: AC
  Administered 2022-12-06: 15 mg via ORAL
  Filled 2022-12-06: qty 1

## 2022-12-06 MED ORDER — RIVAROXABAN (XARELTO) EDUCATION KIT FOR DVT/PE PATIENTS
PACK | Freq: Once | Status: AC
  Filled 2022-12-06: qty 1

## 2022-12-06 NOTE — ED Provider Triage Note (Signed)
Emergency Medicine Provider Triage Evaluation Note  Jamie Newton , a 36 y.o. male  was evaluated in triage.  Pt complains of right lower extremity edema.  States he tripped over his dog approximately 1 month ago and has a mild right knee pain.  No history lower extremity swelling last night.  States he works overnight and  was more noticeable this morning.  Denies numbness, tingling or significant pain of the right lower extremity.  Reports a tight feeling in his posterior calf and hamstring.  This is worsened with foot dorsiflexion or plantarflexion and medial flexion and extension  Review of Systems  Positive: As above Negative: As above  Physical Exam  BP (!) 154/106 (BP Location: Left Arm)   Pulse 82   Temp 98.5 F (36.9 C) (Oral)   Resp 18   Ht 6\' 2"  (1.88 m)   Wt (!) 156.5 kg   SpO2 95%   BMI 44.30 kg/m  Gen:   Awake, no distress   Resp:  Normal effort  MSK:   Moves extremities without difficulty  Other:  Swelling of the right lower extremity.  PT pulses 1+ bilaterally.  Medical Decision Making  Medically screening exam initiated at 12:36 PM.  Appropriate orders placed.  LORAS GRIESHOP was informed that the remainder of the evaluation will be completed by another provider, this initial triage assessment does not replace that evaluation, and the importance of remaining in the ED until their evaluation is complete.     Lisabeth Register, Michelle Piper 12/06/22 1237

## 2022-12-06 NOTE — ED Provider Notes (Signed)
[facility] DEPT Provider Note   CSN: [id number] Arrival date & time: 12/06/22  1133     History  Chief Complaint  Patient presents with   Knee Pain    Jamie Newton is a 36 y.o. male.  Pt is a 36 yo male with a pmhx significant for htn and obesity.  He noticed that his right leg became swollen and painful yesterday.  He has never had a DVT in the past.  He denies cp or sob.  He does drive several hours a day ([place] to [place] and back daily), but denies any recent surgery.       Home Medications Prior to Admission medications   Medication Sig Start Date End Date Taking? Authorizing Provider  RIVAROXABAN Alveda Reasons) VTE STARTER PACK (15 & 20 MG) Follow package directions: Take one 46m tablet by mouth twice a day. On day 22, switch to one 236mtablet once a day. Take with food. 12/06/22  Yes HaIsla PenceMD  acetaminophen (TYLENOL) 650 MG CR tablet Take 650 mg by mouth every 8 (eight) hours as needed for pain.    [provider]  Cholecalciferol (VITAMIN D3) 5000 units TABS 5,000 IU OTC vitamin D3 daily. 11/02/16   OpMellody DanceDO  diclofenac (VOLTAREN) 75 MG EC tablet Take 1 tablet by mouth twice daily 09/29/22   AbLorrene ReidPA-C  losartan-hydrochlorothiazide (HYZAAR) 100-12.5 MG tablet Take 1 tablet by mouth once daily 09/11/22   Abonza, Maritza, PA-C  methylPREDNISolone (MEDROL DOSEPAK) 4 MG TBPK tablet Take as directed on package. 03/28/22   AbLorrene ReidPA-C  Misc Natural Products (OSTEO BI-FLEX JOINT SHIELD) TABS Take 1 tablet by mouth daily.    [provider]  nebivolol (BYSTOLIC) 5 MG tablet Take 1 tablet by mouth once daily 09/11/22   Abonza, Maritza, PA-C  phentermine 15 MG capsule Take 1 capsule (15 mg total) by mouth every morning. 07/04/22   AbLorrene ReidPA-C  Semaglutide-Weight Management (WEGOVY) 0.25 MG/0.5ML SOAJ Inject 0.25 mg into the skin once a week. 06/14/22   AbLorrene ReidPA-C   topiramate (TOPAMAX) 100 MG tablet Take 1 tablet (100 mg total) by mouth daily. 07/04/22   AbLorrene ReidPA-C      Allergies    Amoxicillin and Penicillins    Review of Systems   Review of Systems  Musculoskeletal:        Right leg pain and swelling  All other systems reviewed and are negative.   Physical Exam Updated Vital Signs BP 131/85   Pulse 81   Temp 98.4 F (36.9 C) (Oral)   Resp 16   Ht _0  (1.88 m)   Wt (!) 156.5 kg   SpO2 95%   BMI 44.30 kg/m  Physical Exam Vitals and nursing note reviewed.  Constitutional:      Appearance: Normal appearance. He is obese.  HENT:     Head: Normocephalic and atraumatic.     Right Ear: External ear normal.     Left Ear: External ear normal.     Nose: Nose normal.     Mouth/Throat:     Mouth: Mucous membranes are moist.     Pharynx: Oropharynx is clear.  Eyes:     Extraocular Movements: Extraocular movements intact.     Conjunctiva/sclera: Conjunctivae normal.     Pupils: Pupils are equal, round, and reactive to light.  Cardiovascular:     Rate and Rhythm: Normal rate and regular rhythm.     Pulses:  Normal pulses.     Heart sounds: Normal heart sounds.  Pulmonary:     Effort: Pulmonary effort is normal.     Breath sounds: Normal breath sounds.  Abdominal:     General: Abdomen is flat. Bowel sounds are normal.     Palpations: Abdomen is soft.  Musculoskeletal:     Cervical back: Normal range of motion and neck supple.     Comments: Right leg more swollen than left  Skin:    General: Skin is warm.     Capillary Refill: Capillary refill takes less than 2 seconds.  Neurological:     General: No focal deficit present.     Mental Status: He is alert and oriented to person, place, and time.  Psychiatric:        Mood and Affect: Mood normal.        Behavior: Behavior normal.     ED Results / Procedures / Treatments   Labs (all labs ordered are listed, but only abnormal results are displayed) Labs Reviewed - No  data to display  EKG None  Radiology VAS [place] LOWER EXTREMITY VENOUS (DVT) (7a-7p)  Result Date: 12/06/2022  Lower Venous DVT Study Patient Name:  Jamie Newton  Date of Exam:   12/06/2022 Medical Rec #: [id number]        Accession #:    [id number] Date of Birth: 1986/11/23         Patient Gender: M Patient Age:   33 years Exam Location:  [facility] Procedure:      VAS [place] LOWER EXTREMITY VENOUS (DVT) Referring Phys: Sheppard Coil SCHUTT --------------------------------------------------------------------------------  Indications: Pain.  Risk Factors: None identified. Limitations: Poor ultrasound/tissue interface. Comparison Study: No prior studies. Performing Technologist: Oliver Hum RVT  Examination Guidelines: A complete evaluation includes B-mode imaging, spectral Doppler, color Doppler, and power Doppler as needed of all accessible portions of each vessel. Bilateral testing is considered an integral part of a complete examination. Limited examinations for reoccurring indications may be performed as noted. The reflux portion of the exam is performed with the patient in reverse Trendelenburg.  +---------+---------------+---------+-----------+----------+--------------+ RIGHT    CompressibilityPhasicitySpontaneityPropertiesThrombus Aging +---------+---------------+---------+-----------+----------+--------------+ CFV      Full           Yes      Yes                                 +---------+---------------+---------+-----------+----------+--------------+ SFJ      Full                                                        +---------+---------------+---------+-----------+----------+--------------+ FV Prox  Full                                                        +---------+---------------+---------+-----------+----------+--------------+ FV Mid   None           No       No                   Acute           +---------+---------------+---------+-----------+----------+--------------+  FV DistalNone           No       No                   Acute          +---------+---------------+---------+-----------+----------+--------------+ PFV      Full                                                        +---------+---------------+---------+-----------+----------+--------------+ POP      None           No       No                   Acute          +---------+---------------+---------+-----------+----------+--------------+ PTV      None                                         Acute          +---------+---------------+---------+-----------+----------+--------------+ PERO     None                                         Acute          +---------+---------------+---------+-----------+----------+--------------+ Gastroc  Partial                                      Acute          +---------+---------------+---------+-----------+----------+--------------+   +----+---------------+---------+-----------+----------+--------------+ LEFTCompressibilityPhasicitySpontaneityPropertiesThrombus Aging +----+---------------+---------+-----------+----------+--------------+ CFV Full           Yes      Yes                                 +----+---------------+---------+-----------+----------+--------------+    Summary: RIGHT: - Findings consistent with acute deep vein thrombosis involving the right femoral vein, right popliteal vein, right posterior tibial veins, and right gastrocnemius veins. - No cystic structure found in the popliteal fossa.  LEFT: - No evidence of common femoral vein obstruction.  *See table(s) above for measurements and observations. Electronically signed by Monica Martinez MD on 12/06/2022 at 2:59:38 PM.    Final     Procedures Procedures    Medications Ordered in ED Medications  rivaroxaban Alveda Reasons) Education Kit for DVT/PE patients (has no administration in time  range)  Rivaroxaban (XARELTO) tablet 15 mg (has no administration in time range)    ED Course/ Medical Decision Making/ A&P                           Medical Decision Making  This patient presents to the ED for concern of leg pain, this involves an extensive number of treatment options, and is a complaint that carries with it a high risk of complications and morbidity.  The differential diagnosis includes dvt, fx, msk   Co morbidities that complicate the patient evaluation  Htn and obesity   Additional history  obtained:  Additional history obtained from epic chart review     Imaging Studies ordered:  I ordered imaging studies including [place]  I independently visualized and interpreted imaging which showed + DVT right femoral vein and down I agree with the radiologist interpretation    Medicines ordered and prescription drug management:  I ordered medication including xarelto  for dvt  Reevaluation of the patient after these medicines showed that the patient stayed the same I have reviewed the patients home medicines and have made adjustments as needed  Consultations Obtained:  I requested consultation with the vascular surgeon (Dr. Carlis Abbott),  and discussed lab and imaging findings as well as pertinent plan -he does not recommend thrombectomy.  He recommended pt start anticoagulation and compression hose.  F/u as an outpatient.   Problem List / ED Course:  DVT R leg:  Pt has no cp or sob, so I don't think a CT is needed.  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  Pt prescribed xarelto.  He has no hx of kidney issues, so he's prescribed the usual dose.  He is to f/u with pcp/vascular.   Reevaluation:  After the interventions noted above, I reevaluated the patient and found that they have :improved   Social Determinants of Health:  Lives at home   Dispostion:  After consideration of the diagnostic results and the patients response to treatment, I feel that  the patent would benefit from discharge with outpatient f/u.          Final Clinical Impression(s) / ED Diagnoses Final diagnoses:  Acute deep vein thrombosis (DVT) of femoral vein of right lower extremity ([place])    Rx / DC Orders ED Discharge Orders          Ordered    RIVAROXABAN (XARELTO) VTE STARTER PACK (15 & 20 MG)        12/06/22 1708              Isla Pence, MD 12/06/22 1713

## 2022-12-06 NOTE — ED Triage Notes (Signed)
Rt leg tightness since yesterday. Pain noted, + homans sign, swelling noted

## 2022-12-06 NOTE — Progress Notes (Signed)
Right lower extremity venous duplex has been completed. Preliminary results can be found in CV Proc through chart review.  Results were given to [facility] PA.  12/06/22 1:21 PM Olen Cordial RVT

## 2022-12-06 NOTE — Discharge Instructions (Addendum)
Information on my medicine - XARELTO (rivaroxaban)  This medication education was reviewed with me or my healthcare representative as part of my discharge preparation.  The pharmacist that spoke with me during my hospital stay was:  Keanu Lesniak A, RPH  WHY WAS XARELTO PRESCRIBED FOR YOU? Xarelto was prescribed to treat blood clots that may have been found in the veins of your legs (deep vein thrombosis) or in your lungs (pulmonary embolism) and to reduce the risk of them occurring again.  What do you need to know about Xarelto? The starting dose is one 15 mg tablet taken TWICE daily with food for the FIRST 21 DAYS then on (enter date)  12/28/22  the dose is changed to one 20 mg tablet taken ONCE A DAY with your evening meal.  DO NOT stop taking Xarelto without talking to the health care provider who prescribed the medication.  Refill your prescription for 20 mg tablets before you run out.  After discharge, you should have regular check-up appointments with your healthcare provider that is prescribing your Xarelto.  In the future your dose may need to be changed if your kidney function changes by a significant amount.  What do you do if you miss a dose? If you are taking Xarelto TWICE DAILY and you miss a dose, take it as soon as you remember. You may take two 15 mg tablets (total 30 mg) at the same time then resume your regularly scheduled 15 mg twice daily the next day.  If you are taking Xarelto ONCE DAILY and you miss a dose, take it as soon as you remember on the same day then continue your regularly scheduled once daily regimen the next day. Do not take two doses of Xarelto at the same time.   Important Safety Information Xarelto is a blood thinner medicine that can cause bleeding. You should call your healthcare provider right away if you experience any of the following: Bleeding from an injury or your nose that does not stop. Unusual colored urine (red or dark brown) or  unusual colored stools (red or black). Unusual bruising for unknown reasons. A serious fall or if you hit your head (even if there is no bleeding).  Some medicines may interact with Xarelto and might increase your risk of bleeding while on Xarelto. To help avoid this, consult your healthcare provider or pharmacist prior to using any new prescription or non-prescription medications, including herbals, vitamins, non-steroidal anti-inflammatory drugs (NSAIDs) and supplements.  This website has more information on Xarelto: VisitDestination.com.br.

## 2022-12-15 ENCOUNTER — Encounter: Payer: Self-pay | Admitting: Vascular Surgery

## 2022-12-15 ENCOUNTER — Ambulatory Visit: Payer: BC Managed Care – PPO | Admitting: Vascular Surgery

## 2022-12-15 VITALS — BP 131/89 | HR 66 | Temp 98.5°F | Resp 20 | Ht 74.0 in | Wt 357.0 lb

## 2022-12-15 DIAGNOSIS — I82411 Acute embolism and thrombosis of right femoral vein: Secondary | ICD-10-CM

## 2022-12-15 MED ORDER — RIVAROXABAN 20 MG PO TABS: 20.0000 mg | ORAL_TABLET | Freq: Every day | ORAL | 0 refills | Status: DC

## 2022-12-15 NOTE — Progress Notes (Signed)
Office Note     CC: Right lower extremity DVT Requesting Provider:  Mayer Masker, PA-C  HPI: Jamie Newton is a 36 y.o. (06-Sep-1986) male who presents at the request of Jamie Masker, PA-C for evaluation of right lower extremity DVT.  He was recently seen in the hospital roughly 9 days ago with new onset right lower extremity edema and imaging demonstrating deep venous thrombosis extending from the tibial veins into the femoral vein.  The common femoral vein, external iliac vein were not involved.  He was started on Xarelto and was asked to follow-up with vascular surgery.  On exam today, Jamie Newton was doing well.  A native of [place], he works as a Civil Service fast streamer for [employer], Scientist, physiological.  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  He has no prior history of DVT, family history of DVT, family history of clotting disorders.  Not has lost over 60 pounds in the last year. Current symptoms include some edema at the level of the ankle.  He has been wearing compression stockings on a regular basis.   Past Medical History:  Diagnosis Date   Allergy    SEASONAL   Asthma    DVT (deep venous thrombosis) (HCC)    Hypertension     Past Surgical History:  Procedure Laterality Date   APPENDECTOMY      Social History   Socioeconomic History   Marital status: Married    Spouse name: Tax inspector   Number of children: 2   Years of education: Not on file   Highest education level: Not on file  Occupational History   Occupation: Company secretary: [employer]  Tobacco Use   Smoking status: Never   Smokeless tobacco: Never  Substance and Sexual Activity   Alcohol use: Yes    Alcohol/week: 1.0 standard drink of alcohol    Types: 1 Cans of beer per week   Drug use: No   Sexual activity: Yes  Other Topics Concern   Not on file  Social History Narrative   Not on file   Social Determinants of Health   Financial Resource Strain: Not  on file  Food Insecurity: Not on file  Transportation Needs: Not on file  Physical Activity: Not on file  Stress: Not on file  Social Connections: Not on file  Intimate Partner Violence: Not on file   Family History  Problem Relation Age of Onset   Healthy Brother    Asthma Daughter    Arthritis Maternal Grandmother    Healthy Maternal Grandfather    Early death Paternal Grandfather        unknown   Healthy Brother    Healthy Daughter     Current Outpatient Medications  Medication Sig Dispense Refill   acetaminophen (TYLENOL) 650 MG CR tablet Take 650 mg by mouth every 8 (eight) hours as needed for pain.     Cholecalciferol (VITAMIN D3) 5000 units TABS 5,000 IU OTC vitamin D3 daily. 90 tablet 12   losartan-hydrochlorothiazide (HYZAAR) 100-12.5 MG tablet Take 1 tablet by mouth once daily 90 tablet 0   Misc Natural Products (OSTEO BI-FLEX JOINT SHIELD) TABS Take 1 tablet by mouth daily.     nebivolol (BYSTOLIC) 5 MG tablet Take 1 tablet by mouth once daily 90 tablet 0   RIVAROXABAN (XARELTO) VTE STARTER PACK (15 & 20 MG) Follow package directions: Take one 15mg  tablet by mouth twice a day. On day 22,  switch to one 20mg  tablet once a day. Take with food. 51 each 0   diclofenac (VOLTAREN) 75 MG EC tablet Take 1 tablet by mouth twice daily (Patient not taking: Reported on 12/15/2022) 60 tablet 0   No current facility-administered medications for this visit.    Allergies  Allergen Reactions   Amoxicillin Rash   Penicillins Rash     REVIEW OF SYSTEMS:  [X]  denotes positive finding, [ ]  denotes negative finding Cardiac  Comments:  Chest pain or chest pressure:    Shortness of breath upon exertion:    Short of breath when lying flat:    Irregular heart rhythm:        Vascular    Pain in calf, thigh, or hip brought on by ambulation:    Pain in feet at night that wakes you up from your sleep:     Blood clot in your veins:    Leg swelling:         Pulmonary    Oxygen at  home:    Productive cough:     Wheezing:         Neurologic    Sudden weakness in arms or legs:     Sudden numbness in arms or legs:     Sudden onset of difficulty speaking or slurred speech:    Temporary loss of vision in one eye:     Problems with dizziness:         Gastrointestinal    Blood in stool:     Vomited blood:         Genitourinary    Burning when urinating:     Blood in urine:        Psychiatric    Major depression:         Hematologic    Bleeding problems:    Problems with blood clotting too easily:        Skin    Rashes or ulcers:        Constitutional    Fever or chills:      PHYSICAL EXAMINATION:  Vitals:   12/15/22 1048  BP: 131/89  Pulse: 66  Resp: 20  Temp: 98.5 F (36.9 C)  SpO2: 95%  Weight: (!) 357 lb (161.9 kg)  Height: 6\' 2"  (1.88 m)    General:  WDWN in NAD; vital signs documented above Gait: Not observed HENT: WNL, normocephalic Pulmonary: normal non-labored breathing , without Rales, rhonchi,  wheezing Cardiac: regular HR Abdomen: soft, NT, no masses Skin: without rashes Vascular Exam/Pulses:  Right Left  Radial 2+ (normal) 2+ (normal)  Ulnar    Femoral    Popliteal    DP 2+ (normal) 2+ (normal)  PT     Extremities: without ischemic changes, without Gangrene , without cellulitis; without open wounds;  Musculoskeletal: no muscle wasting or atrophy  Neurologic: A&O X 3;  No focal weakness or paresthesias are detected Psychiatric:  The pt has Normal affect.   Non-Invasive Vascular Imaging:   Show the fuck up Summary:  RIGHT:  - Findings consistent with acute deep vein thrombosis involving the right  femoral vein, right popliteal vein, right posterior tibial veins, and  right gastrocnemius veins.  - No cystic structure found in the popliteal fossa.    LEFT:  - No evidence of common femoral vein obstruction.     ASSESSMENT/PLAN:: 36 y.o. male presenting with right lower extremity DVT extending from the tibial  veins into the femoral vein.  The common femoral  vein, external iliac vein or not involved.  I had a long discussion with [name] regarding his diagnosis.  He would benefit from continued medical management with Xarelto over the next 3 months.  While the DVT could be provoked due to his new role, driving 2 to 4 hours is still a relatively small amount of time.  I plan to refer him back to hematology in an effort to ensure he does not have a genetic predisposition/hypercoagulable disorder.  I asked that he continue wearing his compression stockings and elevate his legs when able.  He will follow-up with our DVT center in 3 months.  I have extended his Xarelto prescription for the next 3 months which was sent to his pharmacy.   He was asked to call if any questions or concerns arise.  We discussed the importance of weight loss, especially in the setting of possible future postphlebitic syndrome.   Victorino Sparrow, MD [facility] [phone]

## 2022-12-23 ENCOUNTER — Inpatient Hospital Stay: Payer: BC Managed Care – PPO | Attending: Hematology and Oncology | Admitting: Hematology and Oncology

## 2022-12-23 ENCOUNTER — Inpatient Hospital Stay: Payer: BC Managed Care – PPO

## 2022-12-23 VITALS — BP 144/99 | HR 74 | Temp 97.9°F | Resp 19 | Ht 74.0 in | Wt 359.9 lb

## 2022-12-23 DIAGNOSIS — D6861 Antiphospholipid syndrome: Secondary | ICD-10-CM | POA: Insufficient documentation

## 2022-12-23 DIAGNOSIS — D6859 Other primary thrombophilia: Secondary | ICD-10-CM | POA: Diagnosis not present

## 2022-12-23 DIAGNOSIS — I82411 Acute embolism and thrombosis of right femoral vein: Secondary | ICD-10-CM

## 2022-12-23 DIAGNOSIS — Z79899 Other long term (current) drug therapy: Secondary | ICD-10-CM | POA: Insufficient documentation

## 2022-12-23 DIAGNOSIS — Z86718 Personal history of other venous thrombosis and embolism: Secondary | ICD-10-CM | POA: Insufficient documentation

## 2022-12-23 DIAGNOSIS — Z7901 Long term (current) use of anticoagulants: Secondary | ICD-10-CM | POA: Insufficient documentation

## 2022-12-23 DIAGNOSIS — I10 Essential (primary) hypertension: Secondary | ICD-10-CM | POA: Insufficient documentation

## 2022-12-23 DIAGNOSIS — D6851 Activated protein C resistance: Secondary | ICD-10-CM | POA: Insufficient documentation

## 2022-12-23 DIAGNOSIS — I82401 Acute embolism and thrombosis of unspecified deep veins of right lower extremity: Secondary | ICD-10-CM | POA: Insufficient documentation

## 2022-12-23 DIAGNOSIS — E669 Obesity, unspecified: Secondary | ICD-10-CM | POA: Diagnosis not present

## 2022-12-23 DIAGNOSIS — Z791 Long term (current) use of non-steroidal anti-inflammatories (NSAID): Secondary | ICD-10-CM | POA: Diagnosis not present

## 2022-12-23 NOTE — Progress Notes (Signed)
[facility] CONSULT NOTE  Patient Care Team: Mayer Masker, PA-C as PCP - General  CHIEF COMPLAINTS/PURPOSE OF CONSULTATION:  Recent diagnosis of extensive DVT of right leg  HISTORY OF PRESENTING ILLNESS:  Jamie Newton 37 y.o. male is here because of extensive right leg DVT.  Patient noticed pain and swelling of the right leg and he went to the emergency room and was diagnosed with an extensive blood clot from the femoral vein to the right gastrocnemius veins.  He was started on Xarelto and was referred to [place] for discussion regarding hypercoagulability.  He works for [employer] and Hospital doctor about [employer].  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  Since he became promoted he has to drive long distances between stores.  Not only that he had severe right leg pain and it was felt to be tendinitis.  Subsequently his 135 pound dog ran into him and caused much severe pain in the leg and he was not able to walk very well for couple of weeks after that.  This was the only other precipitating factor.  He is obese but has lost about 100 pounds over the past year.  I reviewed her records extensively and collaborated the history with the patient.   MEDICAL HISTORY:  Past Medical History:  Diagnosis Date   Allergy    SEASONAL   Asthma    DVT (deep venous thrombosis) (HCC)    Hypertension     SURGICAL HISTORY: Past Surgical History:  Procedure Laterality Date   APPENDECTOMY      SOCIAL HISTORY: Social History   Socioeconomic History   Marital status: Married    Spouse name: Tax inspector   Number of children: 2   Years of education: Not on file   Highest education level: Not on file  Occupational History   Occupation: Company secretary: [id number]  Tobacco Use   Smoking status: Never   Smokeless tobacco: Never  Substance and Sexual Activity   Alcohol use: Yes    Alcohol/week: 1.0 standard drink of alcohol    Types: 1 Cans of beer per  week   Drug use: No   Sexual activity: Yes  Other Topics Concern   Not on file  Social History Narrative   Not on file   Social Determinants of Health   Financial Resource Strain: Not on file  Food Insecurity: Not on file  Transportation Needs: Not on file  Physical Activity: Not on file  Stress: Not on file  Social Connections: Not on file  Intimate Partner Violence: Not on file    FAMILY HISTORY: Family History  Problem Relation Age of Onset   Healthy Brother    Asthma Daughter    Arthritis Maternal Grandmother    Healthy Maternal Grandfather    Early death Paternal Grandfather        unknown   Healthy Brother    Healthy Daughter     ALLERGIES:  is allergic to amoxicillin and penicillins.  MEDICATIONS:  Current Outpatient Medications  Medication Sig Dispense Refill   acetaminophen (TYLENOL) 650 MG CR tablet Take 650 mg by mouth every 8 (eight) hours as needed for pain.     Cholecalciferol (VITAMIN D3) 5000 units TABS 5,000 IU OTC vitamin D3 daily. 90 tablet 12   diclofenac (VOLTAREN) 75 MG EC tablet Take 1 tablet by mouth twice daily (Patient not taking: Reported on 12/15/2022) 60 tablet 0  losartan-hydrochlorothiazide (HYZAAR) 100-12.5 MG tablet Take 1 tablet by mouth once daily 90 tablet 0   Misc Natural Products (OSTEO BI-FLEX JOINT SHIELD) TABS Take 1 tablet by mouth daily.     nebivolol (BYSTOLIC) 5 MG tablet Take 1 tablet by mouth once daily 90 tablet 0   rivaroxaban (XARELTO) 20 MG TABS tablet Take 1 tablet (20 mg total) by mouth daily with supper. 60 tablet 0   RIVAROXABAN (XARELTO) VTE STARTER PACK (15 & 20 MG) Follow package directions: Take one 15mg  tablet by mouth twice a day. On day 22, switch to one 20mg  tablet once a day. Take with food. 51 each 0   No current facility-administered medications for this visit.    REVIEW OF SYSTEMS:   Constitutional: Denies fevers, chills or abnormal night sweats All other systems were reviewed with the patient and  are negative.  PHYSICAL EXAMINATION: ECOG PERFORMANCE STATUS: 1 - Symptomatic but completely ambulatory  Vitals:   12/23/22 0854  BP: (!) 144/99  Pulse: 74  Resp: 19  Temp: 97.9 F (36.6 C)  SpO2: 99%   Filed Weights   12/23/22 0854  Weight: (!) 359 lb 14.4 oz (163.2 kg)    GENERAL:alert, no distress and comfortable  LABORATORY DATA:  I have reviewed the data as listed Lab Results  Component Value Date   WBC 9.2 04/06/2021   HGB 15.9 04/06/2021   HCT 46.0 04/06/2021   MCV 90 04/06/2021   PLT 232 04/06/2021   Lab Results  Component Value Date   NA 143 04/06/2021   K 4.4 04/06/2021   CL 101 04/06/2021   CO2 25 04/06/2021    RADIOGRAPHIC STUDIES: I have personally reviewed the radiological reports and agreed with the findings in the report.  ASSESSMENT AND PLAN:  Right leg DVT ([place]) 12/06/2021: ED visit for right leg swelling revealed acute DVT involving right femoral vein, right popliteal vein, right posterior tibial vein and right gastrocnemius veins  I discussed with the patient risk factors for blood clots. He lost 100 pounds over the past 1 year. He got promoted at [employer] and he now has to travel between stores and is in the car more number of hours in a sedentary position than before. His only other risk factor is obesity. He reports that his 135 pound dog ran into him and caused severe pain in his right leg about 3 weeks prior to his diagnosis of blood clot.  This could be another reason for the blood clot.  Inherited risk factors include: 1. Factor V Leiden mutation 2. Prothrombin gene G20210A 3. Protein S deficiency  4. Protein C deficiency  5. Antithrombin deficiency  Acquired risk factors include: 1. Antiphospholipid antibody syndrome 2. Tobacco use: Patient does not smoke 3. Obesity 4. Medications: He does not take any testosterone replacement 5. Sedentary behavior: It is quite possible that his new promotion may be the reason for him being  more sedentary than before 6. Foreign bodies in circulation: None  Workup recommended: Bloodwork to evaluate for the 5 inherited factors mentioned above along with antiphospholipid antibodies. Telephone visit in 1 week to discuss the results of the tests   All questions were answered. The patient knows to call the clinic with any problems, questions or concerns.    Harriette Ohara, MD 12/23/22

## 2022-12-23 NOTE — Assessment & Plan Note (Signed)
12/06/2021: ED visit for right leg swelling revealed acute DVT involving right femoral vein, right popliteal vein, right posterior tibial vein and right gastrocnemius veins  I discussed with the patient risk factors for blood clots.  Inherited risk factors include: 1. Factor V Leiden mutation 2. Prothrombin gene G20210A 3. Protein S deficiency  4. Protein C deficiency  5. Antithrombin deficiency  Acquired risk factors include: 1. Antiphospholipid antibody syndrome 2. Tobacco use 3. Obesity 4. Medications 5. Sedentary behavior including postoperative state 6. Foreign bodies in circulation  Workup recommended: Bloodwork to evaluate for the 5 inherited factors mentioned above along with antiphospholipid antibodies. Return to clinic in one to 2 weeks to discuss the results of the tests

## 2022-12-25 LAB — BETA-2-GLYCOPROTEIN I ABS, IGG/M/A
Beta-2 Glyco I IgG: <9 GPI IgG units (ref 0–20)
Beta-2-Glycoprotein I IgA: <9 GPI IgA units (ref 0–25)
Beta-2-Glycoprotein I IgM: <9 GPI IgM units (ref 0–32)

## 2022-12-26 LAB — CARDIOLIPIN ANTIBODIES, IGG, IGM, IGA
Anticardiolipin IgA: <9 APL U/mL (ref 0–11)
Anticardiolipin IgG: <9 GPL U/mL (ref 0–14)
Anticardiolipin IgM: 15 MPL U/mL — ABNORMAL HIGH (ref 0–12)

## 2022-12-26 LAB — PROTEIN C, TOTAL: Protein C, Total: 100 % (ref 60–150)

## 2022-12-27 LAB — FACTOR 8 ASSAY: Coagulation Factor VIII: 156 % — ABNORMAL HIGH (ref 56–140)

## 2022-12-27 LAB — ANTITHROMBIN III ANTIGEN: AT III AG PPP IMM-ACNC: 85 % (ref 72–124)

## 2022-12-27 LAB — PROTEIN S, TOTAL: Protein S Ag, Total: 144 % (ref 60–150)

## 2022-12-28 LAB — LUPUS ANTICOAGULANT PANEL
DRVVT: >180 s — ABNORMAL HIGH (ref 0.0–47.0)
PTT Lupus Anticoagulant: 67.1 s — ABNORMAL HIGH (ref 0.0–43.5)

## 2022-12-28 LAB — FACTOR 5 LEIDEN

## 2022-12-28 LAB — HEXAGONAL PHASE PHOSPHOLIPID: Hexagonal Phase Phospholipid: 19 s — ABNORMAL HIGH (ref 0–11)

## 2022-12-28 LAB — DRVVT MIX: dRVVT Mix: 106.1 s — ABNORMAL HIGH (ref 0.0–40.4)

## 2022-12-28 LAB — DRVVT CONFIRM: dRVVT Confirm: >2.8 ratio — ABNORMAL HIGH (ref 0.8–1.2)

## 2022-12-28 LAB — PTT-LA MIX: PTT-LA Mix: 69.6 s — ABNORMAL HIGH (ref 0.0–40.5)

## 2022-12-29 ENCOUNTER — Inpatient Hospital Stay (HOSPITAL_BASED_OUTPATIENT_CLINIC_OR_DEPARTMENT_OTHER): Payer: BC Managed Care – PPO | Admitting: Hematology and Oncology

## 2022-12-29 DIAGNOSIS — I82411 Acute embolism and thrombosis of right femoral vein: Secondary | ICD-10-CM

## 2022-12-29 NOTE — Progress Notes (Signed)
HEMATOLOGY-ONCOLOGY TELEPHONE VISIT PROGRESS NOTE  I connected with our patient on 12/29/22 at  8:30 AM EST by telephone and verified that I am speaking with the correct person using two identifiers.  I discussed the limitations, risks, security and privacy concerns of performing an evaluation and management service by telephone and the availability of in person appointments.  I also discussed with the patient that there may be a patient responsible charge related to this service. The patient expressed understanding and agreed to proceed.   History of Present Illness: Telephone follow-up to get the results of the blood work  REVIEW OF SYSTEMS:   Constitutional: Denies fevers, chills or abnormal weight loss All other systems were reviewed with the patient and are negative. Observations/Objective:     Assessment Plan:  Right leg DVT ([place]) 12/06/2021: ED visit for right leg swelling revealed acute DVT involving right femoral vein, right popliteal vein, right posterior tibial vein and right gastrocnemius veins   I discussed with the patient risk factors for blood clots. He lost 100 pounds over the past 1 year. He got promoted at [employer] and he now has to travel between stores and is in the car more number of hours in a sedentary position than before. His only other risk factor is obesity. He reports that his 135 pound dog ran into him and caused severe pain in his right leg about 3 weeks prior to his diagnosis of blood clot.  This could be another reason for the blood clot.   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  Discussion: I discussed with the patient the significance of antiphospholipid antibodies.  However only repeat antibodies  positivity is considered to be truly positive.  There could also be false positives in the setting of anticoagulation.  We will plan to repeat blood work in 3 months and follow-up after that.  If repeat testing is also positive then we will consider lifelong anticoagulation.   I discussed the assessment and treatment plan with the patient. The patient was provided an opportunity to ask questions and all were answered. The patient agreed with the plan and demonstrated an understanding of the instructions. The patient was advised to call back or seek an in-person evaluation if the symptoms worsen or if the condition fails to improve as anticipated.   I provided 12 minutes of non-face-to-face time during this encounter.  This includes time for charting and coordination of care   Harriette Ohara, MD

## 2022-12-29 NOTE — Assessment & Plan Note (Signed)
12/06/2021: ED visit for right leg swelling revealed acute DVT involving right femoral vein, right popliteal vein, right posterior tibial vein and right gastrocnemius veins   I discussed with the patient risk factors for blood clots. He lost 100 pounds over the past 1 year. He got promoted at [employer] and he now has to travel between stores and is in the car more number of hours in a sedentary position than before. His only other risk factor is obesity. He reports that his 135 pound dog ran into him and caused severe pain in his right leg about 3 weeks prior to his diagnosis of blood clot.  This could be another reason for the blood clot.   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  Discussion: I discussed with the patient the significance of antiphospholipid antibodies.  However only repeat antibodies positivity is considered to be truly positive.  There could also be false positives in the setting of anticoagulation.  We will plan to repeat blood work in 3 months and follow-up after that.  If repeat testing is also positive then we will consider lifelong anticoagulation.

## 2023-01-01 LAB — PROTHROMBIN GENE MUTATION

## 2023-01-02 ENCOUNTER — Telehealth: Payer: Self-pay | Admitting: Hematology and Oncology

## 2023-01-02 NOTE — Telephone Encounter (Signed)
Scheduled appointments per 1/11 los. Left voicemail.

## 2023-02-06 ENCOUNTER — Other Ambulatory Visit (HOSPITAL_COMMUNITY): Payer: Self-pay

## 2023-02-07 ENCOUNTER — Ambulatory Visit (HOSPITAL_COMMUNITY)
Admission: RE | Admit: 2023-02-07 | Discharge: 2023-02-07 | Disposition: A | Discharge status: Home / Self Care | Payer: BC Managed Care – PPO | Source: Ambulatory Visit | Attending: Vascular Surgery | Admitting: Vascular Surgery

## 2023-02-07 VITALS — BP 138/102 | HR 58

## 2023-02-07 DIAGNOSIS — I82411 Acute embolism and thrombosis of right femoral vein: Secondary | ICD-10-CM | POA: Diagnosis not present

## 2023-02-07 NOTE — Patient Instructions (Signed)
-  Continue Xarelto 20 mg daily. Be sure to take it with food.  -It is important to take your medication around the same time every day.  -Avoid NSAIDs like ibuprofen (Advil, Motrin) and naproxen (Aleve) as well as aspirin doses over 100 mg daily. -Tylenol (acetaminophen) is the preferred over the counter pain medication to lower the risk of bleeding. -Be sure to alert all of your health care providers that you are taking an anticoagulant prior to starting a new medication or having a procedure. -Monitor for signs and symptoms of bleeding (abnormal bruising, prolonged bleeding, nose bleeds, bleeding from gums, discolored urine, black tarry stools). If you have fallen and hit your head OR if your bleeding is severe or not stopping, seek emergency care.  -Go to the emergency room if emergent signs and symptoms of new clot occur (new or worse swelling and pain in an arm or leg, shortness of breath, chest pain, fast or irregular heartbeats, lightheadedness, dizziness, fainting, coughing up blood) or if you experience a significant color change (pale or blue) in the extremity that has the DVT.  -We recommend you wear thigh high compression stockings (20-30 mmHg) as long as you are having swelling or pain. Be sure to purchase the correct size and take them off at night.   Follow up: with hematology in April. Reach out to DVT Clinic as needed.   If you have any questions or need to reschedule an appointment, please call [phone].  If you are having an emergency, call 911 or present to the nearest emergency room.   What is a DVT?  -Deep vein thrombosis (DVT) is a condition in which a blood clot forms in a vein of the deep venous system which can occur in the lower leg, thigh, pelvis, arm, or neck. This condition is serious and can be life-threatening if the clot travels to the arteries of the lungs and causing a blockage (pulmonary embolism, PE). A DVT can also damage veins in the leg, which can lead to  long-term venous disease, leg pain, swelling, discoloration, and ulcers or sores (post-thrombotic syndrome).  -Treatment may include taking an anticoagulant medication to prevent more clots from forming and the current clot from growing, wearing compression stockings, and/or surgical procedures to remove or dissolve the clot.

## 2023-02-07 NOTE — Progress Notes (Signed)
DVT Clinic Note  Name: Jamie Newton     MRN: [id number]     DOB: Dec 30, 1985     Sex: male  PCP: Jamie Reid, PA-C (Inactive)  Today's Visit: Visit Information: Initial Visit  Referred to CPP by: Jamie Newton Reason for referral:  Chief Complaint  Patient presents with   Med Management - DVT   HISTORY OF PRESENT ILLNESS:  Jamie Newton is a 37 y.o. male who presents after diagnosis of DVT extending from the tibial veins into the femoral vein last December. He was seen in the ED 12/06/22 when DVT was diagnosed and started on Xarelto. Patient was seen by Jamie Newton 12/15/22 and referred for follow up medication management. At his visit with Jamie Newton, they discussed the plan for medication management only as there was no role for thrombectomy. Risk factors include obesity though the patient has lost over 60 lbs in the past year. Patient reports he has a very large dog who ran into him in the weeks leading up to his initial symptom start, wonders if trauma from that could have caused DVT. 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 He was seen by Jamie Newton with hematology on 12/23/22 for hypercoagulable work up. Some of his labs came back abnormal for lupus anticoagulant, and they are repeating them in a few months to confirm the results since anticoagulation can cause false positives. Today, he reports his pain waxes and wanes depending on his swelling. He has been wearing compression stockings and using a compression device while he drives. Denies missed doses of Xarelto. No bruising or bleeding on Xarelto. He is not currently taking Xarelto with food. Reports cost was $135/month.   Positive Thrombotic Risk Factors: Recent trauma (within 3 months), Obesity (leg injury by his large dog running into his R leg) Bleeding Risk Factors: Anticoagulant therapy  Negative Thrombotic Risk Factors:  Previous VTE, Recent surgery (within 3 months), Recent admission to hospital with acute illness (within 3 months), Paralysis, paresis, or recent plaster cast immobilization of lower extremity, Central venous catheterization, Bed rest >72 hours within 3 months, Sedentary journey lasting >8 hours within 4 weeks, Pregnancy, Testosterone therapy, Estrogen therapy, Recent cesarean section (within 3 months), Within 6 weeks postpartum, Erythropoiesis-stimulating agent, Recent COVID diagnosis (within 3 months), Active cancer, Older age, Smoking, Known thrombophilic condition, Non-malignant, chronic inflammatory condition  Rx Jamie Coverage: Commercial Rx Affordability: Copay is $135. Provided patient today with coupon to bring cost down to $10.  Preferred Pharmacy: [facility] on [address]. Reports he has enough refills currently.   Past Medical History:  Diagnosis Date   Allergy    SEASONAL   Asthma    DVT (deep venous thrombosis) (HCC)    Hypertension     Past Surgical History:  Procedure Laterality Date   APPENDECTOMY      Social History   Socioeconomic History   Marital status: Married    Spouse name: Jamie Newton   Number of children: 2   Years of education: Not on file   Highest education level: Not on file  Occupational History   Occupation: Dentist: [id number]  Tobacco Use   Smoking status: Never   Smokeless tobacco: Never  Substance and Sexual Activity   Alcohol use: Yes    Alcohol/week: 1.0 standard drink of alcohol  Types: 1 Cans of beer per week   Drug use: No   Sexual activity: Yes  Other Topics Concern   Not on file  Social History Narrative   Not on file   Social Determinants of Health   Financial Resource Strain: Not on file  Food Insecurity: Not on file  Transportation Needs: Not on file  Physical Activity: Not on file  Stress: Not on file  Social Connections: Not on file  Intimate Partner Violence: Not on file    Family History   Problem Relation Age of Onset   Healthy Brother    Asthma Daughter    Arthritis Maternal Grandmother    Healthy Maternal Grandfather    Early death Paternal Grandfather        unknown   Healthy Brother    Healthy Daughter     Allergies as of 02/07/2023 - Review Complete 02/07/2023  Allergen Reaction Noted   Amoxicillin Rash 10/04/2016   Penicillins Rash 07/24/2012    Current Outpatient Medications on File Prior to Encounter  Medication Sig Dispense Refill   acetaminophen (TYLENOL) 650 MG CR tablet Take 650 mg by mouth every 8 (eight) hours as needed for pain.     Cholecalciferol (VITAMIN D3) 5000 units TABS 5,000 IU OTC vitamin D3 daily. 90 tablet 12   diclofenac (VOLTAREN) 75 MG EC tablet Take 1 tablet by mouth twice daily 60 tablet 0   losartan-hydrochlorothiazide (HYZAAR) 100-12.5 MG tablet Take 1 tablet by mouth once daily 90 tablet 0   Misc Natural Products (OSTEO BI-FLEX JOINT SHIELD) TABS Take 1 tablet by mouth daily.     nebivolol (BYSTOLIC) 5 MG tablet Take 1 tablet by mouth once daily 90 tablet 0   OVER THE COUNTER MEDICATION Take 1 capsule by mouth in the morning and at bedtime. Takes Onnit supplement twice daily     rivaroxaban (XARELTO) 20 MG TABS tablet Take 1 tablet (20 mg total) by mouth daily with supper. 60 tablet 0   No current facility-administered medications on file prior to encounter.   REVIEW OF SYSTEMS:  Review of Systems  Respiratory:  Negative for shortness of breath.   Cardiovascular:  Positive for leg swelling. Negative for chest pain and palpitations.  Musculoskeletal:  Positive for myalgias.  Neurological:  Positive for tingling. Negative for dizziness.   PHYSICAL EXAMINATION:  Vitals:   02/07/23 1532  BP: (!) 138/102  Pulse: (!) 58  SpO2: 100%    There is no height or weight on file to calculate BMI.  Physical Exam Cardiovascular:     Rate and Rhythm: Normal rate.  Pulmonary:     Effort: Pulmonary effort is normal.  Musculoskeletal:      Right lower leg: Edema present.     Left lower leg: No edema.  Psychiatric:        Mood and Affect: Mood normal.        Behavior: Behavior normal.        Thought Content: Thought content normal.   Villalta Score for Post-Thrombotic Syndrome: Pain: Mild Cramps: Moderate Heaviness: Moderate Paresthesia: Mild Pruritus: Mild Pretibial Edema: Moderate Skin Induration: Absent Hyperpigmentation: Absent Redness: Absent Venous Ectasia: Absent Pain on calf compression: Absent Villalta Preliminary Score: 9 Is venous ulcer present?: No If venous ulcer is present and score is <15, then 15 points total are assigned: Absent Villalta Total Score: 9  LABS:  CBC     Component Value Date/Time   WBC 9.2 04/06/2021 1037   WBC 9.2 10/11/2016 0824  RBC 5.10 04/06/2021 1037   RBC 4.90 10/11/2016 0824   HGB 15.9 04/06/2021 1037   HCT 46.0 04/06/2021 1037   PLT 232 04/06/2021 1037   MCV 90 04/06/2021 1037   MCH 31.2 04/06/2021 1037   MCH 29.6 10/11/2016 0824   MCHC 34.6 04/06/2021 1037   MCHC 34.3 10/11/2016 0824   RDW 13.2 04/06/2021 1037   LYMPHSABS 2.6 04/06/2021 1037   MONOABS 920 10/11/2016 0824   EOSABS 0.2 04/06/2021 1037   BASOSABS 0.1 04/06/2021 1037    Hepatic Function      Component Value Date/Time   PROT 7.4 04/06/2021 1037   ALBUMIN 4.2 04/06/2021 1037   AST 30 04/06/2021 1037   ALT 62 (H) 04/06/2021 1037   ALKPHOS 80 04/06/2021 1037   BILITOT 0.3 04/06/2021 1037    Renal Function   Lab Results  Component Value Date   CREATININE 1.01 04/06/2021   CREATININE 0.93 10/11/2016   CREATININE 0.97 10/13/2008    CrCl cannot be calculated (Patient's most recent lab result is older than the maximum 21 days allowed.).   [facility]VS Vascular Lab Studies:  12/06/22 VAS [place] LOWER EXTREMITY VENOUS (DVT) RIGHT Summary:  RIGHT:  - Findings consistent with acute deep vein thrombosis involving the right  femoral vein, right popliteal vein, right posterior tibial veins, and  right  gastrocnemius veins.  - No cystic structure found in the popliteal fossa.    LEFT:  - No evidence of common femoral vein obstruction.   ASSESSMENT: Location of DVT: Right femoral vein, Right popliteal vein, Right distal vein Cause of DVT: provoked by a transient risk factor though is undergoing hypercoagulable work up by hematology which initially showed lupus anticoagulant. Plan is to repeat this in April to confirm since false positives are possible in the setting of anticoagulation.   PLAN: -Continue rivaroxaban (Xarelto) 20 mg daily with food. -Expected duration of therapy: At least 3 months. Will defer duration to hematology after hypercoagulable work up. Therapy started on 12/06/22. -Patient educated on purpose, proper use and potential adverse effects of rivaroxaban (Xarelto). -Discussed importance of taking medication around the same time every day. -Advised patient of medications to avoid (NSAIDs, aspirin doses >100 mg daily). -Educated that Tylenol (acetaminophen) is the preferred analgesic to lower the risk of bleeding. -Advised patient to alert all providers of anticoagulation therapy prior to starting a new medication or having a procedure. -Emphasized importance of monitoring for signs and symptoms of bleeding (abnormal bruising, prolonged bleeding, nose bleeds, bleeding from gums, discolored urine, black tarry stools). -Educated patient to present to the ED if emergent signs and symptoms of new thrombosis occur. -Counseled patient to wear compression stockings daily, removing at night. -Counseled patient to elevate legs to help with swelling.  -Counseled on importance of taking Xarelto with food for efficacy. He will start to do this.   Follow up: With hematology in April. With DVT Clinic as needed.   Rebbeca Paul, PharmD, Para [place], CPP Deep Vein Thrombosis Clinic Clinical Pharmacist Practitioner Office: [phone]

## 2023-02-27 DIAGNOSIS — M722 Plantar fascial fibromatosis: Secondary | ICD-10-CM | POA: Diagnosis not present

## 2023-02-27 DIAGNOSIS — M25572 Pain in left ankle and joints of left foot: Secondary | ICD-10-CM | POA: Diagnosis not present

## 2023-02-27 DIAGNOSIS — M7662 Achilles tendinitis, left leg: Secondary | ICD-10-CM | POA: Diagnosis not present

## 2023-03-30 ENCOUNTER — Inpatient Hospital Stay: Payer: BC Managed Care – PPO | Attending: Hematology and Oncology

## 2023-03-30 ENCOUNTER — Other Ambulatory Visit: Payer: Self-pay

## 2023-03-30 DIAGNOSIS — Z86718 Personal history of other venous thrombosis and embolism: Secondary | ICD-10-CM | POA: Insufficient documentation

## 2023-03-30 DIAGNOSIS — I82411 Acute embolism and thrombosis of right femoral vein: Secondary | ICD-10-CM

## 2023-03-31 LAB — LUPUS ANTICOAGULANT PANEL
DRVVT: 39.8 s (ref 0.0–47.0)
PTT Lupus Anticoagulant: 36.2 s (ref 0.0–43.5)

## 2023-04-02 LAB — CARDIOLIPIN ANTIBODIES, IGG, IGM, IGA
Anticardiolipin IgA: <9 APL U/mL (ref 0–11)
Anticardiolipin IgG: <9 GPL U/mL (ref 0–14)
Anticardiolipin IgM: <9 MPL U/mL (ref 0–12)

## 2023-04-03 NOTE — Progress Notes (Signed)
HEMATOLOGY-ONCOLOGY TELEPHONE VISIT PROGRESS NOTE  I connected with our patient on 04/06/23 at  9:30 AM EDT by telephone and verified that I am speaking with the correct person using two identifiers.  I discussed the limitations, risks, security and privacy concerns of performing an evaluation and management service by telephone and the availability of in person appointments.  I also discussed with the patient that there may be a patient responsible charge related to this service. The patient expressed understanding and agreed to proceed.   History of Present Illness: Jamie Newton 37 y.o. male is here because of extensive right leg DVT. He presents to the clinic for a telephone follow-up to review lab results.  He reports that the leg swelling has improved significantly.  He is tolerating blood thinners extremely well without any problems.  He requests a refill of the blood thinner.  REVIEW OF SYSTEMS:   Constitutional: Denies fevers, chills or abnormal weight loss All other systems were reviewed with the patient and are negative. 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  Even the anticardiolipin IgM was negative.  Recommendation for anticoagulation: Recheck leg ultrasound to verify resolution of blood clots (this will be done in first week of June) Stop anticoagulation at 6 months  Patient understands that there is a further risk of blood clots after anticoagulation gets discontinued and if there is any additional blood clots then anticoagulation for life will be recommended.  Telephone visit  day after the ultrasound to discuss results  I discussed the assessment and treatment plan with the patient. The patient was provided an opportunity to ask questions and all were answered. The patient agreed with the plan and demonstrated an understanding of the instructions. The patient was advised to call back or seek an in-person evaluation if the symptoms worsen or if the condition fails to improve as anticipated.   I provided 12 minutes of non-face-to-face time during this encounter.  This includes time for charting and coordination of care   Tamsen Meek, MD  I Janan Ridge am acting as a scribe for Dr.Claron Rosencrans  I have reviewed the above documentation for accuracy and completeness, and I agree with the above.

## 2023-04-06 ENCOUNTER — Inpatient Hospital Stay (HOSPITAL_BASED_OUTPATIENT_CLINIC_OR_DEPARTMENT_OTHER): Payer: BC Managed Care – PPO | Admitting: Hematology and Oncology

## 2023-04-06 DIAGNOSIS — I82411 Acute embolism and thrombosis of right femoral vein: Secondary | ICD-10-CM | POA: Diagnosis not present

## 2023-04-06 MED ORDER — RIVAROXABAN 20 MG PO TABS: 20.0000 mg | ORAL_TABLET | Freq: Every day | ORAL | 0 refills | Status: DC

## 2023-04-06 NOTE — Assessment & Plan Note (Signed)
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  Even the anticardiolipin IgM was negative.  Recommendation for anticoagulation: Recheck leg ultrasound to verify resolution of blood clots Stop anticoagulation at 6 months  Patient understands that there is a further risk of blood clots after anticoagulation gets discontinued and if there is any additional blood clots then anticoagulation for life will be recommended.

## 2023-04-09 ENCOUNTER — Telehealth: Payer: Self-pay | Admitting: Hematology and Oncology

## 2023-04-09 ENCOUNTER — Telehealth (HOSPITAL_BASED_OUTPATIENT_CLINIC_OR_DEPARTMENT_OTHER): Payer: Self-pay | Admitting: *Deleted

## 2023-04-09 NOTE — Telephone Encounter (Signed)
Left message for patient to call and schedule the lower venous doppler ordered by Dr. Pamelia Hoit by

## 2023-04-09 NOTE — Telephone Encounter (Signed)
Scheduled appointment per 4/19 los. Left voicemail.

## 2023-04-10 NOTE — Telephone Encounter (Signed)
Left message for patient to call and schedule lower extremity venous doppler ordered by Dr. Pamelia Hoit

## 2023-04-11 NOTE — Telephone Encounter (Signed)
Left message for patient to call and schedule the lower venous doppler ordered by Dr. Pamelia Hoit

## 2023-04-13 ENCOUNTER — Encounter (HOSPITAL_BASED_OUTPATIENT_CLINIC_OR_DEPARTMENT_OTHER): Payer: Self-pay | Admitting: *Deleted

## 2023-04-13 NOTE — Telephone Encounter (Signed)
Left message for patient to call and discuss scheduling the lower extreity venous doppler ordered by Dr. Gwynneth Macleod also mail letter requesting patient call

## 2023-05-25 ENCOUNTER — Telehealth: Payer: Self-pay

## 2023-05-25 ENCOUNTER — Inpatient Hospital Stay: Payer: BC Managed Care – PPO | Admitting: Hematology and Oncology

## 2023-05-25 NOTE — Telephone Encounter (Signed)
Error

## 2023-05-25 NOTE — Telephone Encounter (Signed)
S/w pt regarding televisit. Pt states he had difficulty scheduling is LE doppler. Pt has been scneduled for doppler at [facility] 06/12/23 at 0900 and MD telephone f/u 06/15/23 at 0930. Left detailed message for pt.

## 2023-06-10 NOTE — Progress Notes (Signed)
HEMATOLOGY-ONCOLOGY TELEPHONE VISIT PROGRESS NOTE  I connected with our patient on 06/15/23 at  9:30 AM EDT by telephone and verified that I am speaking with the correct person using two identifiers.  I discussed the limitations, risks, security and privacy concerns of performing an evaluation and management service by telephone and the availability of in person appointments.  I also discussed with the patient that there may be a patient responsible charge related to this service. The patient expressed understanding and agreed to proceed.   History of Present Illness: Jamie Newton 37 y.o. male is here because of extensive right leg DVT. He presents to the clinic for a telephone follow-up to review ultrasound lower extremity results.      REVIEW OF SYSTEMS:   Constitutional: Denies fevers, chills or abnormal weight loss All other systems were reviewed with the patient and are negative. 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  Even the anticardiolipin IgM was negative.   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    I discussed the assessment and treatment plan with the patient. The patient was provided an opportunity to ask questions and all were answered. The patient agreed with the plan and demonstrated an understanding of  the instructions. The patient was advised to call back or seek an in-person evaluation if the symptoms worsen or if the condition fails to improve as anticipated.   I provided 12 minutes of non-face-to-face time during this encounter.  This includes time for charting and coordination of care   Tamsen Meek, MD  I Janan Ridge am acting as a scribe for Dr.Jabe Jeanbaptiste  I have reviewed the above documentation for accuracy and completeness, and I agree with the above.

## 2023-06-12 ENCOUNTER — Ambulatory Visit (HOSPITAL_COMMUNITY)
Admission: RE | Admit: 2023-06-12 | Discharge: 2023-06-12 | Disposition: A | Discharge status: Home / Self Care | Payer: BC Managed Care – PPO | Source: Ambulatory Visit | Attending: Hematology and Oncology | Admitting: Hematology and Oncology

## 2023-06-12 DIAGNOSIS — I82411 Acute embolism and thrombosis of right femoral vein: Secondary | ICD-10-CM | POA: Diagnosis not present

## 2023-06-12 NOTE — Progress Notes (Signed)
Right lower extremity venous study completed.  Please see CV Procedures for preliminary results.  Kemoni Quesenberry, RVT  9:10 AM 06/12/23

## 2023-06-15 ENCOUNTER — Inpatient Hospital Stay: Payer: BC Managed Care – PPO | Attending: Hematology and Oncology | Admitting: Hematology and Oncology

## 2023-06-15 DIAGNOSIS — I82411 Acute embolism and thrombosis of right femoral vein: Secondary | ICD-10-CM | POA: Diagnosis not present

## 2023-06-15 MED ORDER — RIVAROXABAN 20 MG PO TABS: 20.0000 mg | ORAL_TABLET | Freq: Every day | ORAL | 1 refills | Status: DC

## 2023-06-15 NOTE — Assessment & Plan Note (Signed)
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  Even the anticardiolipin IgM was negative.   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

## 2023-06-18 ENCOUNTER — Telehealth: Payer: Self-pay | Admitting: Hematology and Oncology

## 2023-06-18 NOTE — Telephone Encounter (Signed)
Scheduled appointment per 6/28 los. Patient is aware of the made appointment. 

## 2023-09-25 ENCOUNTER — Ambulatory Visit: Payer: BC Managed Care – PPO | Admitting: Family Medicine

## 2023-09-25 ENCOUNTER — Encounter: Payer: Self-pay | Admitting: Family Medicine

## 2023-09-25 VITALS — BP 125/84 | HR 75 | Ht 74.0 in | Wt 374.4 lb

## 2023-09-25 DIAGNOSIS — I10 Essential (primary) hypertension: Secondary | ICD-10-CM | POA: Diagnosis not present

## 2023-09-25 MED ORDER — LOSARTAN POTASSIUM-HCTZ 100-12.5 MG PO TABS: 1.0000 | ORAL_TABLET | Freq: Every day | ORAL | 0 refills | Status: DC

## 2023-09-25 NOTE — Patient Instructions (Addendum)
It was nice to see you today,  We addressed the following topics today: -I have restarted your Hyzaar.  Take this medication daily. - Recheck your blood pressure while you are at home and when you come back next month we can discuss the blood pressure readings you have had at home. - I would like to check your kidney and liver function again.  Please schedule a lab visit for [place] in the next 1 to 2 weeks.  I will also check A1c and cholesterol panel since I have not seen this ordered in a few years.    Have a great day,  Frederic Jericho, MD

## 2023-09-25 NOTE — Assessment & Plan Note (Signed)
Patient's blood pressure normal in office today but has been elevated on multiple other occasions that other doctor visits this year.  Will restart his Hyzaar.  Advised to check blood pressure at home.  Follow back up in 1 month.  If still elevated add amlodipine.

## 2023-09-25 NOTE — Progress Notes (Unsigned)
   Established Patient Office Visit  Subjective   Patient ID: Jamie Newton, male    DOB: Apr 27, 1986  Age: 37 y.o. MRN: [id number]  Chief Complaint  Patient presents with   Medical Management of Chronic Issues    HPI  Hypertension-patient has not taken his blood pressure medication in over a year.  He was going through a divorce and also around the same time his provider was switched to a new provider.  He now feels like he should go back on his blood pressure medication.  We discussed his blood pressure today which was normal, and his previous readings and other office visits earlier this year that were all elevated.  Patient is not certain which medications he took last but believes amlodipine and Hyzaar are correct.  Does not regularly check his blood pressure at home but has access to a blood pressure cuff.  Patient is currently taking Xarelto for DVT.  Has an appointment with he oncologist in December.  Has been taking Xarelto for approximately 9 months he states.   The ASCVD Risk score (Arnett DK, et al., 2019) failed to calculate for the following reasons:   The 2019 ASCVD risk score is only valid for ages 20 to 33  Health Maintenance Due  Topic Date Due   HIV Screening  Never done   Hepatitis C Screening  Never done   INFLUENZA VACCINE  Never done   COVID-19 Vaccine (1 - 2023-24 season) Never done      Objective:     BP 125/84   Pulse 75   Ht 6\' 2"  (1.88 m)   Wt (!) 374 lb 6.4 oz (169.8 kg)   SpO2 95%   BMI 48.07 kg/m    Physical Exam General: Alert, oriented.  Obese male. Pulmonary: No respiratory distress Psych: Pleasant affect    No results found for any visits on 09/25/23.      Assessment & Plan:   HTN (hypertension), benign Assessment & Plan: Patient's blood pressure normal in office today but has been elevated on multiple other occasions that other doctor visits this year.  Will restart his Hyzaar.  Advised to check blood pressure at home.   Follow back up in 1 month.  If still elevated add amlodipine.  Orders: -     Comprehensive metabolic panel; Future -     Losartan Potassium-HCTZ; Take 1 tablet by mouth daily.  Dispense: 90 tablet; Refill: 0  Morbid obesity (HCC) -     Hemoglobin A1c; Future -     Lipid panel; Future     Return in about 4 weeks (around 10/23/2023) for HTN.    Sandre Kitty, MD

## 2023-10-04 ENCOUNTER — Other Ambulatory Visit: Payer: BC Managed Care – PPO

## 2023-10-23 ENCOUNTER — Ambulatory Visit: Payer: BC Managed Care – PPO | Admitting: Family Medicine

## 2023-10-23 NOTE — Progress Notes (Deleted)
   Established Patient Office Visit  Subjective   Patient ID: Jamie Newton, male    DOB: Mar 19, 1986  Age: 37 y.o. MRN: [id number]  No chief complaint on file.   HPI  Hypertension-Hyzaar restarted.  Labs-get them   The ASCVD Risk score (Arnett DK, et al., 2019) failed to calculate for the following reasons:   The 2019 ASCVD risk score is only valid for ages 23 to 44  Health Maintenance Due  Topic Date Due   HIV Screening  Never done   Hepatitis C Screening  Never done   INFLUENZA VACCINE  Never done   COVID-19 Vaccine (1 - 2023-24 season) Never done      Objective:     There were no vitals taken for this visit. {Vitals History (Optional):23777}  Physical Exam   No results found for any visits on 10/23/23.      Assessment & Plan:   There are no diagnoses linked to this encounter.   No follow-ups on file.    Sandre Kitty, MD

## 2023-12-14 ENCOUNTER — Ambulatory Visit (HOSPITAL_COMMUNITY)
Admission: RE | Admit: 2023-12-14 | Discharge: 2023-12-14 | Disposition: A | Discharge status: Home / Self Care | Payer: BC Managed Care – PPO | Source: Ambulatory Visit | Attending: Hematology and Oncology | Admitting: Hematology and Oncology

## 2023-12-14 DIAGNOSIS — I82411 Acute embolism and thrombosis of right femoral vein: Secondary | ICD-10-CM | POA: Diagnosis present

## 2023-12-24 ENCOUNTER — Inpatient Hospital Stay: Payer: BC Managed Care – PPO | Attending: Hematology and Oncology | Admitting: Hematology and Oncology

## 2023-12-24 DIAGNOSIS — I82411 Acute embolism and thrombosis of right femoral vein: Secondary | ICD-10-CM

## 2023-12-24 NOTE — Progress Notes (Signed)
 I called the patient twice and left a message to call us back. The ultrasound of the leg shows chronic DVT changes. My read mentation would be to continue anticoagulation and recheck with another ultrasound in 6 months.

## 2023-12-24 NOTE — Assessment & Plan Note (Signed)
 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  Even the anticardiolipin IgM was negative.   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  Therefore I recommended continued anticoagulation.  Plan to recheck with ultrasound in 6 months.

## 2023-12-25 ENCOUNTER — Ambulatory Visit: Payer: Self-pay | Admitting: Podiatry

## 2023-12-25 ENCOUNTER — Other Ambulatory Visit: Payer: Self-pay | Admitting: *Deleted

## 2023-12-25 DIAGNOSIS — M722 Plantar fascial fibromatosis: Secondary | ICD-10-CM

## 2023-12-25 DIAGNOSIS — I82411 Acute embolism and thrombosis of right femoral vein: Secondary | ICD-10-CM

## 2023-12-25 MED ORDER — RIVAROXABAN 20 MG PO TABS: 20.0000 mg | ORAL_TABLET | Freq: Every day | ORAL | 2 refills | Status: DC

## 2023-12-25 NOTE — Telephone Encounter (Signed)
 RN placed call to pt with MD recommendations of continuing anticoagulation and recheck VAS [place] in 6 months.  Orders placed, pt verbalized understanding of tx plan.

## 2024-01-09 ENCOUNTER — Telehealth: Payer: Self-pay | Admitting: Hematology and Oncology

## 2024-01-09 NOTE — Telephone Encounter (Signed)
Scheduled appointment per scheduling message. Patient is aware of the made appointment and is active on MyChart.

## 2024-04-16 ENCOUNTER — Other Ambulatory Visit: Payer: Self-pay | Admitting: Family Medicine

## 2024-04-16 DIAGNOSIS — I10 Essential (primary) hypertension: Secondary | ICD-10-CM

## 2024-04-16 NOTE — Telephone Encounter (Signed)
 LVM for return to schedule appt

## 2024-04-18 NOTE — Telephone Encounter (Signed)
 LVM for pt to call office to schedule an appt. Will also send a mychart message informing pt of this as well.

## 2024-06-23 ENCOUNTER — Ambulatory Visit (HOSPITAL_COMMUNITY): Payer: BC Managed Care – PPO

## 2024-06-25 NOTE — Assessment & Plan Note (Signed)
 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  Even the anticardiolipin IgM was negative.   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

## 2024-06-26 ENCOUNTER — Inpatient Hospital Stay: Payer: BC Managed Care – PPO | Attending: Hematology and Oncology | Admitting: Hematology and Oncology

## 2024-06-26 DIAGNOSIS — I82411 Acute embolism and thrombosis of right femoral vein: Secondary | ICD-10-CM

## 2024-06-26 NOTE — Progress Notes (Signed)
 HEMATOLOGY-ONCOLOGY TELEPHONE VISIT PROGRESS NOTE  I connected with our patient on 06/26/24 at  3:00 PM EDT by telephone and verified that I am speaking with the correct person using two identifiers.  I discussed the limitations, risks, security and privacy concerns of performing an evaluation and management service by telephone and the availability of in person appointments.  I also discussed with the patient that there may be a patient responsible charge related to this service. The patient expressed understanding and agreed to proceed.   History of Present Illness: Follow-up of history of recurrent DVTs  History of Present Illness  patient is experiencingJoseph Newton Jamie Newton is a 38 year old male who presents for follow-up regarding his right leg condition.  He experiences no swelling or significant discomfort in his right leg, though he occasionally feels an undefined sensation. He continues to manage his condition with blood thinners and daily use of compression socks. The medication costs approximately $150 per month, which he finds expensive. The last ultrasound of his right leg was conducted at the end of December.    REVIEW OF SYSTEMS:   Constitutional: Denies fevers, chills or abnormal weight loss All other systems were reviewed with the patient and are negative. 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  Even the anticardiolipin IgM was negative.   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  Because it costs $110 per month, patient is experiencing financial toxicity from long-term anticoagulation therapy.  Assessment & Plan Deep vein thrombosis in the right leg No swelling or significant discomfort. Residual thrombus noted on last ultrasound. Medication cost is a concern. - Order ultrasound of the right leg at [facility] to assess current status of DVT. - Continue current anticoagulation regimen. - Continue wearing compression socks daily.   I discussed the assessment and treatment plan with the patient. The patient was provided an opportunity to ask questions and all were answered. The patient agreed with the plan and demonstrated an understanding of the instructions. The patient was advised to call back or seek an in-person evaluation if the symptoms worsen or if the condition fails to improve as anticipated.   I provided 20 minutes of non-face-to-face time during this encounter.  This includes time for charting and coordination of care   Naomi MARLA Chad, MD

## 2024-06-27 ENCOUNTER — Telehealth: Payer: Self-pay | Admitting: Hematology and Oncology

## 2024-06-27 NOTE — Telephone Encounter (Signed)
 left vm for pt about scheduled telephone visit date and time

## 2024-07-22 ENCOUNTER — Inpatient Hospital Stay: Admitting: Hematology and Oncology

## 2024-07-22 ENCOUNTER — Telehealth: Payer: Self-pay | Admitting: *Deleted

## 2024-07-22 NOTE — Progress Notes (Signed)
 error

## 2024-07-22 NOTE — Telephone Encounter (Signed)
 Per MD request, telephone visit canceled for today.  Pt was supposed to have repeat vas US  of right lower extremity prior to MD visit.  RN attempt x1 to contact pt.  No answer, LVM with number for Vas US  and alerted pt to notify our office once US  is scheduled to set up MD visit.

## 2024-07-22 NOTE — Assessment & Plan Note (Signed)
 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  Even the anticardiolipin IgM was negative.   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  Because it costs $110 per month, patient is experiencing financial toxicity from long-term anticoagulation therapy.

## 2024-08-07 ENCOUNTER — Other Ambulatory Visit: Payer: Self-pay

## 2024-08-08 ENCOUNTER — Ambulatory Visit (HOSPITAL_COMMUNITY)
Admission: RE | Admit: 2024-08-08 | Discharge: 2024-08-08 | Disposition: A | Discharge status: Home / Self Care | Source: Ambulatory Visit | Attending: Vascular Surgery | Admitting: Vascular Surgery

## 2024-08-08 DIAGNOSIS — I82411 Acute embolism and thrombosis of right femoral vein: Secondary | ICD-10-CM | POA: Diagnosis present

## 2025-01-02 ENCOUNTER — Other Ambulatory Visit: Payer: Self-pay | Admitting: Hematology and Oncology

## 2025-01-08 ENCOUNTER — Telehealth: Payer: Self-pay | Admitting: Hematology and Oncology

## 2025-01-08 NOTE — Telephone Encounter (Signed)
 Left vm for pt about scheduled telephone appt date and time. Encouraged to call back if need to reschedule or any questions/concerns

## 2025-02-05 ENCOUNTER — Inpatient Hospital Stay: Admitting: Hematology and Oncology
